# Patient Record
Sex: Female | Born: 1935 | Race: White | Hispanic: No | Smoking: Never smoker
Health system: Southern US, Community
[De-identification: ages and names within clinical notes are randomized; demographics above are authoritative.]

## PROBLEM LIST (undated history)

## (undated) DIAGNOSIS — J45909 Unspecified asthma, uncomplicated: Secondary | ICD-10-CM

## (undated) DIAGNOSIS — Z974 Presence of external hearing-aid: Secondary | ICD-10-CM

## (undated) DIAGNOSIS — R600 Localized edema: Secondary | ICD-10-CM

## (undated) DIAGNOSIS — I82409 Acute embolism and thrombosis of unspecified deep veins of unspecified lower extremity: Secondary | ICD-10-CM

## (undated) DIAGNOSIS — E785 Hyperlipidemia, unspecified: Secondary | ICD-10-CM

## (undated) DIAGNOSIS — N189 Chronic kidney disease, unspecified: Secondary | ICD-10-CM

## (undated) DIAGNOSIS — G4730 Sleep apnea, unspecified: Secondary | ICD-10-CM

## (undated) DIAGNOSIS — R519 Headache, unspecified: Secondary | ICD-10-CM

## (undated) DIAGNOSIS — I2699 Other pulmonary embolism without acute cor pulmonale: Secondary | ICD-10-CM

## (undated) DIAGNOSIS — I10 Essential (primary) hypertension: Secondary | ICD-10-CM

## (undated) DIAGNOSIS — E079 Disorder of thyroid, unspecified: Secondary | ICD-10-CM

## (undated) DIAGNOSIS — M1990 Unspecified osteoarthritis, unspecified site: Secondary | ICD-10-CM

## (undated) DIAGNOSIS — I509 Heart failure, unspecified: Secondary | ICD-10-CM

## (undated) HISTORY — DX: Disorder of thyroid, unspecified: E07.9

## (undated) HISTORY — DX: Acute embolism and thrombosis of unspecified deep veins of unspecified lower extremity: I82.409

## (undated) HISTORY — PX: DILATATION & CURETTAGE/HYSTEROSCOPY WITH MYOSURE: SHX6511

## (undated) HISTORY — DX: Essential (primary) hypertension: I10

## (undated) HISTORY — PX: VARICOSE VEIN SURGERY: SHX832

## (undated) HISTORY — PX: EYE SURGERY: SHX253

## (undated) HISTORY — PX: OTHER SURGICAL HISTORY: SHX169

## (undated) HISTORY — PX: DILATION AND CURETTAGE OF UTERUS: SHX78

## (undated) HISTORY — DX: Other pulmonary embolism without acute cor pulmonale: I26.99

## (undated) HISTORY — DX: Hyperlipidemia, unspecified: E78.5

## (undated) HISTORY — PX: ENDOMETRIAL BIOPSY: SHX622

---

## 1999-12-05 DIAGNOSIS — I2699 Other pulmonary embolism without acute cor pulmonale: Secondary | ICD-10-CM

## 1999-12-05 DIAGNOSIS — I82409 Acute embolism and thrombosis of unspecified deep veins of unspecified lower extremity: Secondary | ICD-10-CM

## 1999-12-05 HISTORY — DX: Acute embolism and thrombosis of unspecified deep veins of unspecified lower extremity: I82.409

## 1999-12-05 HISTORY — DX: Other pulmonary embolism without acute cor pulmonale: I26.99

## 2005-08-15 ENCOUNTER — Ambulatory Visit: Payer: Self-pay | Admitting: Unknown Physician Specialty

## 2006-08-21 ENCOUNTER — Ambulatory Visit: Payer: Self-pay | Admitting: Unknown Physician Specialty

## 2006-08-22 ENCOUNTER — Ambulatory Visit: Payer: Self-pay | Admitting: Internal Medicine

## 2006-10-05 ENCOUNTER — Other Ambulatory Visit: Payer: Self-pay

## 2006-10-05 ENCOUNTER — Ambulatory Visit: Payer: Self-pay | Admitting: General Surgery

## 2006-10-12 ENCOUNTER — Ambulatory Visit: Payer: Self-pay | Admitting: General Surgery

## 2006-11-16 ENCOUNTER — Ambulatory Visit: Payer: Self-pay | Admitting: Gastroenterology

## 2006-11-23 ENCOUNTER — Ambulatory Visit: Payer: Self-pay | Admitting: Gastroenterology

## 2007-02-15 ENCOUNTER — Ambulatory Visit: Payer: Self-pay | Admitting: Gastroenterology

## 2007-10-23 ENCOUNTER — Ambulatory Visit: Payer: Self-pay | Admitting: Unknown Physician Specialty

## 2008-03-12 ENCOUNTER — Emergency Department: Payer: Self-pay | Admitting: Emergency Medicine

## 2008-11-04 ENCOUNTER — Ambulatory Visit: Payer: Self-pay | Admitting: Unknown Physician Specialty

## 2010-01-25 ENCOUNTER — Ambulatory Visit: Payer: Self-pay | Admitting: Unknown Physician Specialty

## 2010-01-26 ENCOUNTER — Ambulatory Visit: Payer: Self-pay | Admitting: Internal Medicine

## 2010-07-05 ENCOUNTER — Ambulatory Visit: Payer: Self-pay | Admitting: Internal Medicine

## 2011-01-17 ENCOUNTER — Ambulatory Visit: Payer: Self-pay | Admitting: Gastroenterology

## 2011-01-19 LAB — PATHOLOGY REPORT

## 2011-01-27 ENCOUNTER — Ambulatory Visit: Payer: Self-pay | Admitting: Unknown Physician Specialty

## 2011-03-29 ENCOUNTER — Inpatient Hospital Stay: Payer: Self-pay | Admitting: General Practice

## 2011-05-10 ENCOUNTER — Other Ambulatory Visit: Payer: Self-pay | Admitting: Vascular Surgery

## 2011-05-24 ENCOUNTER — Other Ambulatory Visit: Payer: Self-pay | Admitting: Family Medicine

## 2011-07-10 ENCOUNTER — Other Ambulatory Visit: Payer: Self-pay | Admitting: Family Medicine

## 2011-08-28 ENCOUNTER — Other Ambulatory Visit: Payer: Self-pay | Admitting: Internal Medicine

## 2011-09-04 ENCOUNTER — Other Ambulatory Visit: Payer: Self-pay | Admitting: Internal Medicine

## 2011-09-11 ENCOUNTER — Ambulatory Visit: Payer: Self-pay | Admitting: Vascular Surgery

## 2012-03-13 ENCOUNTER — Ambulatory Visit: Payer: Self-pay

## 2013-08-12 ENCOUNTER — Ambulatory Visit: Payer: Self-pay | Admitting: Obstetrics and Gynecology

## 2013-08-12 LAB — URINALYSIS, COMPLETE
Blood: NEGATIVE
Hyaline Cast: 5
Leukocyte Esterase: NEGATIVE
Nitrite: NEGATIVE
Ph: 5 (ref 4.5–8.0)
Squamous Epithelial: <1

## 2013-08-12 LAB — COMPREHENSIVE METABOLIC PANEL
Anion Gap: 4 — ABNORMAL LOW (ref 7–16)
BUN: 17 mg/dL (ref 7–18)
Bilirubin,Total: 0.8 mg/dL (ref 0.2–1.0)
Calcium, Total: 9.5 mg/dL (ref 8.5–10.1)
Co2: 31 mmol/L (ref 21–32)
Creatinine: 0.92 mg/dL (ref 0.60–1.30)
EGFR (African American): >60
EGFR (Non-African Amer.): >60
Glucose: 94 mg/dL (ref 65–99)
Potassium: 3.8 mmol/L (ref 3.5–5.1)
SGOT(AST): 25 U/L (ref 15–37)
SGPT (ALT): 27 U/L (ref 12–78)
Sodium: 138 mmol/L (ref 136–145)

## 2013-08-12 LAB — CBC
HGB: 14.9 g/dL (ref 12.0–16.0)
MCH: 31.1 pg (ref 26.0–34.0)
MCHC: 33.8 g/dL (ref 32.0–36.0)
Platelet: 185 10*3/uL (ref 150–440)
RBC: 4.79 10*6/uL (ref 3.80–5.20)
RDW: 13.9 % (ref 11.5–14.5)

## 2013-08-21 ENCOUNTER — Ambulatory Visit: Payer: Self-pay | Admitting: Obstetrics and Gynecology

## 2013-08-22 LAB — PATHOLOGY REPORT

## 2014-06-19 DIAGNOSIS — Z8371 Family history of colonic polyps: Secondary | ICD-10-CM | POA: Insufficient documentation

## 2014-06-19 DIAGNOSIS — Z83719 Family history of colon polyps, unspecified: Secondary | ICD-10-CM | POA: Insufficient documentation

## 2014-06-23 ENCOUNTER — Ambulatory Visit: Payer: Self-pay | Admitting: Gastroenterology

## 2014-06-26 LAB — PATHOLOGY REPORT

## 2015-01-05 DIAGNOSIS — I10 Essential (primary) hypertension: Secondary | ICD-10-CM | POA: Diagnosis not present

## 2015-01-05 DIAGNOSIS — E039 Hypothyroidism, unspecified: Secondary | ICD-10-CM | POA: Diagnosis not present

## 2015-01-05 DIAGNOSIS — I83813 Varicose veins of bilateral lower extremities with pain: Secondary | ICD-10-CM | POA: Diagnosis not present

## 2015-01-05 DIAGNOSIS — F330 Major depressive disorder, recurrent, mild: Secondary | ICD-10-CM | POA: Diagnosis not present

## 2015-02-09 DIAGNOSIS — I10 Essential (primary) hypertension: Secondary | ICD-10-CM | POA: Diagnosis not present

## 2015-02-09 DIAGNOSIS — I80293 Phlebitis and thrombophlebitis of other deep vessels of lower extremity, bilateral: Secondary | ICD-10-CM | POA: Diagnosis not present

## 2015-02-09 DIAGNOSIS — E039 Hypothyroidism, unspecified: Secondary | ICD-10-CM | POA: Diagnosis not present

## 2015-02-09 DIAGNOSIS — Z6832 Body mass index (BMI) 32.0-32.9, adult: Secondary | ICD-10-CM | POA: Diagnosis not present

## 2015-02-09 DIAGNOSIS — J060 Acute laryngopharyngitis: Secondary | ICD-10-CM | POA: Diagnosis not present

## 2015-02-09 DIAGNOSIS — N390 Urinary tract infection, site not specified: Secondary | ICD-10-CM | POA: Diagnosis not present

## 2015-02-13 DIAGNOSIS — E039 Hypothyroidism, unspecified: Secondary | ICD-10-CM | POA: Diagnosis not present

## 2015-02-13 DIAGNOSIS — Z86711 Personal history of pulmonary embolism: Secondary | ICD-10-CM | POA: Diagnosis not present

## 2015-02-13 DIAGNOSIS — I10 Essential (primary) hypertension: Secondary | ICD-10-CM | POA: Diagnosis not present

## 2015-02-13 DIAGNOSIS — J449 Chronic obstructive pulmonary disease, unspecified: Secondary | ICD-10-CM | POA: Diagnosis not present

## 2015-02-13 DIAGNOSIS — E785 Hyperlipidemia, unspecified: Secondary | ICD-10-CM | POA: Diagnosis not present

## 2015-02-13 DIAGNOSIS — M2550 Pain in unspecified joint: Secondary | ICD-10-CM | POA: Diagnosis not present

## 2015-02-13 DIAGNOSIS — K219 Gastro-esophageal reflux disease without esophagitis: Secondary | ICD-10-CM | POA: Diagnosis not present

## 2015-02-13 DIAGNOSIS — E669 Obesity, unspecified: Secondary | ICD-10-CM | POA: Diagnosis not present

## 2015-02-13 DIAGNOSIS — I739 Peripheral vascular disease, unspecified: Secondary | ICD-10-CM | POA: Diagnosis not present

## 2015-02-13 DIAGNOSIS — J45909 Unspecified asthma, uncomplicated: Secondary | ICD-10-CM | POA: Diagnosis not present

## 2015-03-19 DIAGNOSIS — G4733 Obstructive sleep apnea (adult) (pediatric): Secondary | ICD-10-CM | POA: Diagnosis not present

## 2015-03-26 NOTE — Op Note (Signed)
PATIENT NAME:  Nancy Moyer, Nancy Moyer MR#:  [id number] DATE OF BIRTH:  [birth year]  DATE OF PROCEDURE:  08/21/2013  PREOPERATIVE DIAGNOSIS: Submucosal fibroid and postmenopausal bleeding.   POSTOPERATIVE DIAGNOSIS: Submucosal fibroid and postmenopausal bleeding.   PROCEDURE: Hysteroscopic myomectomy with MyoSure.   ANESTHESIA: General.  SURGEON: Dr. Donzetta Matters.   ESTIMATED BLOOD LOSS: 50 mL.   OPERATIVE FLUIDS: One liter deficit of 1400 mL of lactated Ringer.   COMPLICATIONS: Possible uterine perforation.   FINDINGS: Posterior submucosal fibroid.   SPECIMEN: Endometrial curettings and fibroids.   INDICATIONS: The patient is a 79 year old who presents with postmenopausal bleeding. She was found to have a possible submucosal uterine fibroid at the time of ultrasound. In office sonohysterogram could not be completed secondary to severe cervical stenosis. The patient is brought to the operating room for surgical evaluation and removal of the submucosal fibroid. The risks, benefits, indications, and alternatives of the procedure were explained to the patient and informed consent was obtained.   PROCEDURE: The patient was taken to the Operating Room with IV fluids running. She was placed under general anesthesia. She was prepped and draped in the usual sterile fashion in candy cane stirrups. Speculum was placed inside of the vagina. The anterior lip of the cervix was grasped with a single-tooth tenaculum. The cervical os was again found to be stenotic. A  lacrimal duct probe was used to locate the cervical os and entered carefully. The cervix was then able to be dilated using Hank dilators. The hysteroscope was placed inside of the uterus and findings were as previously stated. The MyoSure device was then placed in through the input channel on the hysteroscope. The fibroid was then resected. At one portion during the procedure it was noted that resection was no longer able to be completed  secondary to the fibroid likely being calcified. The hysteroscope was removed from the patient's uterus and serrated edge banjo curette was used to attempt to remove the fibroid tissue. The hysteroscope was then replaced and it was seen that the fibroid was still in place. The MyoSure device was then replaced through the hysteroscope and further resection was attempted. On the third bag of lactated Ringer's distention media, it was noted that the deficit was increased to approximately 1400 mL indicating a possible uterine perforation. At this point, the procedure was ended. All instrumentation was removed from the patient's vagina and cervix was hemostatic. Sponge and instrument counts were correct x2. The patient was awakened from anesthesia and taken to the recovery room in stable condition.   ____________________________ Rolm Gala Ferne Reus, MD law:sg D: 08/21/2013 10:50:45 ET T: 08/21/2013 11:47:30 ET JOB#: [id number]  cc: Sherlynn Carbon A. Ferne Reus, MD, <Dictator> Rolm Gala WEAVER LEE MD ELECTRONICALLY SIGNED 08/26/2013 14:09

## 2015-05-18 DIAGNOSIS — F330 Major depressive disorder, recurrent, mild: Secondary | ICD-10-CM | POA: Diagnosis not present

## 2015-05-18 DIAGNOSIS — I10 Essential (primary) hypertension: Secondary | ICD-10-CM | POA: Diagnosis not present

## 2015-05-18 DIAGNOSIS — E782 Mixed hyperlipidemia: Secondary | ICD-10-CM | POA: Diagnosis not present

## 2015-05-18 DIAGNOSIS — I83813 Varicose veins of bilateral lower extremities with pain: Secondary | ICD-10-CM | POA: Diagnosis not present

## 2015-05-18 DIAGNOSIS — R300 Dysuria: Secondary | ICD-10-CM | POA: Diagnosis not present

## 2015-05-18 DIAGNOSIS — Z0001 Encounter for general adult medical examination with abnormal findings: Secondary | ICD-10-CM | POA: Diagnosis not present

## 2015-05-18 DIAGNOSIS — E039 Hypothyroidism, unspecified: Secondary | ICD-10-CM | POA: Diagnosis not present

## 2015-05-18 DIAGNOSIS — M810 Age-related osteoporosis without current pathological fracture: Secondary | ICD-10-CM | POA: Diagnosis not present

## 2015-05-27 DIAGNOSIS — M8580 Other specified disorders of bone density and structure, unspecified site: Secondary | ICD-10-CM | POA: Diagnosis not present

## 2015-05-27 DIAGNOSIS — Z780 Asymptomatic menopausal state: Secondary | ICD-10-CM | POA: Diagnosis not present

## 2015-05-27 DIAGNOSIS — M85862 Other specified disorders of bone density and structure, left lower leg: Secondary | ICD-10-CM | POA: Diagnosis not present

## 2015-05-27 DIAGNOSIS — Z13820 Encounter for screening for osteoporosis: Secondary | ICD-10-CM | POA: Diagnosis not present

## 2015-05-27 DIAGNOSIS — M8588 Other specified disorders of bone density and structure, other site: Secondary | ICD-10-CM | POA: Diagnosis not present

## 2015-07-12 DIAGNOSIS — Z1151 Encounter for screening for human papillomavirus (HPV): Secondary | ICD-10-CM | POA: Diagnosis not present

## 2015-07-12 DIAGNOSIS — N912 Amenorrhea, unspecified: Secondary | ICD-10-CM | POA: Diagnosis not present

## 2015-07-12 DIAGNOSIS — Z1231 Encounter for screening mammogram for malignant neoplasm of breast: Secondary | ICD-10-CM | POA: Diagnosis not present

## 2015-09-20 DIAGNOSIS — I10 Essential (primary) hypertension: Secondary | ICD-10-CM | POA: Diagnosis not present

## 2015-09-20 DIAGNOSIS — E039 Hypothyroidism, unspecified: Secondary | ICD-10-CM | POA: Diagnosis not present

## 2015-09-20 DIAGNOSIS — R7301 Impaired fasting glucose: Secondary | ICD-10-CM | POA: Diagnosis not present

## 2015-09-20 DIAGNOSIS — M810 Age-related osteoporosis without current pathological fracture: Secondary | ICD-10-CM | POA: Diagnosis not present

## 2015-09-20 DIAGNOSIS — F330 Major depressive disorder, recurrent, mild: Secondary | ICD-10-CM | POA: Diagnosis not present

## 2015-09-20 DIAGNOSIS — I83813 Varicose veins of bilateral lower extremities with pain: Secondary | ICD-10-CM | POA: Diagnosis not present

## 2015-09-20 DIAGNOSIS — Z0001 Encounter for general adult medical examination with abnormal findings: Secondary | ICD-10-CM | POA: Diagnosis not present

## 2015-09-20 DIAGNOSIS — E782 Mixed hyperlipidemia: Secondary | ICD-10-CM | POA: Diagnosis not present

## 2015-10-27 DIAGNOSIS — N189 Chronic kidney disease, unspecified: Secondary | ICD-10-CM | POA: Diagnosis not present

## 2015-11-01 DIAGNOSIS — I10 Essential (primary) hypertension: Secondary | ICD-10-CM | POA: Diagnosis not present

## 2015-11-01 DIAGNOSIS — I83813 Varicose veins of bilateral lower extremities with pain: Secondary | ICD-10-CM | POA: Diagnosis not present

## 2015-11-01 DIAGNOSIS — E039 Hypothyroidism, unspecified: Secondary | ICD-10-CM | POA: Diagnosis not present

## 2015-11-01 DIAGNOSIS — Z23 Encounter for immunization: Secondary | ICD-10-CM | POA: Diagnosis not present

## 2015-11-01 DIAGNOSIS — R7301 Impaired fasting glucose: Secondary | ICD-10-CM | POA: Diagnosis not present

## 2015-11-01 DIAGNOSIS — N181 Chronic kidney disease, stage 1: Secondary | ICD-10-CM | POA: Diagnosis not present

## 2015-11-17 DIAGNOSIS — L03211 Cellulitis of face: Secondary | ICD-10-CM | POA: Diagnosis not present

## 2015-11-17 DIAGNOSIS — E039 Hypothyroidism, unspecified: Secondary | ICD-10-CM | POA: Diagnosis not present

## 2015-11-17 DIAGNOSIS — I10 Essential (primary) hypertension: Secondary | ICD-10-CM | POA: Diagnosis not present

## 2015-11-17 DIAGNOSIS — M810 Age-related osteoporosis without current pathological fracture: Secondary | ICD-10-CM | POA: Diagnosis not present

## 2016-01-26 DIAGNOSIS — E782 Mixed hyperlipidemia: Secondary | ICD-10-CM | POA: Diagnosis not present

## 2016-01-26 DIAGNOSIS — I10 Essential (primary) hypertension: Secondary | ICD-10-CM | POA: Diagnosis not present

## 2016-01-26 DIAGNOSIS — E039 Hypothyroidism, unspecified: Secondary | ICD-10-CM | POA: Diagnosis not present

## 2016-02-01 DIAGNOSIS — F330 Major depressive disorder, recurrent, mild: Secondary | ICD-10-CM | POA: Diagnosis not present

## 2016-02-01 DIAGNOSIS — N181 Chronic kidney disease, stage 1: Secondary | ICD-10-CM | POA: Diagnosis not present

## 2016-02-01 DIAGNOSIS — I10 Essential (primary) hypertension: Secondary | ICD-10-CM | POA: Diagnosis not present

## 2016-02-01 DIAGNOSIS — L309 Dermatitis, unspecified: Secondary | ICD-10-CM | POA: Diagnosis not present

## 2016-03-02 DIAGNOSIS — G4733 Obstructive sleep apnea (adult) (pediatric): Secondary | ICD-10-CM | POA: Diagnosis not present

## 2016-05-22 DIAGNOSIS — N390 Urinary tract infection, site not specified: Secondary | ICD-10-CM | POA: Diagnosis not present

## 2016-05-22 DIAGNOSIS — M810 Age-related osteoporosis without current pathological fracture: Secondary | ICD-10-CM | POA: Diagnosis not present

## 2016-05-22 DIAGNOSIS — R7301 Impaired fasting glucose: Secondary | ICD-10-CM | POA: Diagnosis not present

## 2016-05-22 DIAGNOSIS — Z0001 Encounter for general adult medical examination with abnormal findings: Secondary | ICD-10-CM | POA: Diagnosis not present

## 2016-05-22 DIAGNOSIS — E039 Hypothyroidism, unspecified: Secondary | ICD-10-CM | POA: Diagnosis not present

## 2016-05-22 DIAGNOSIS — I83813 Varicose veins of bilateral lower extremities with pain: Secondary | ICD-10-CM | POA: Diagnosis not present

## 2016-05-22 DIAGNOSIS — I10 Essential (primary) hypertension: Secondary | ICD-10-CM | POA: Diagnosis not present

## 2016-05-24 DIAGNOSIS — I83813 Varicose veins of bilateral lower extremities with pain: Secondary | ICD-10-CM | POA: Diagnosis not present

## 2016-06-15 DIAGNOSIS — G4733 Obstructive sleep apnea (adult) (pediatric): Secondary | ICD-10-CM | POA: Diagnosis not present

## 2016-06-27 DIAGNOSIS — F330 Major depressive disorder, recurrent, mild: Secondary | ICD-10-CM | POA: Diagnosis not present

## 2016-06-27 DIAGNOSIS — N390 Urinary tract infection, site not specified: Secondary | ICD-10-CM | POA: Diagnosis not present

## 2016-06-27 DIAGNOSIS — M810 Age-related osteoporosis without current pathological fracture: Secondary | ICD-10-CM | POA: Diagnosis not present

## 2016-06-27 DIAGNOSIS — J0111 Acute recurrent frontal sinusitis: Secondary | ICD-10-CM | POA: Diagnosis not present

## 2016-06-27 DIAGNOSIS — I83813 Varicose veins of bilateral lower extremities with pain: Secondary | ICD-10-CM | POA: Diagnosis not present

## 2016-06-27 DIAGNOSIS — R601 Generalized edema: Secondary | ICD-10-CM | POA: Diagnosis not present

## 2016-06-27 DIAGNOSIS — J301 Allergic rhinitis due to pollen: Secondary | ICD-10-CM | POA: Diagnosis not present

## 2016-06-28 DIAGNOSIS — R601 Generalized edema: Secondary | ICD-10-CM | POA: Diagnosis not present

## 2016-07-06 DIAGNOSIS — J0111 Acute recurrent frontal sinusitis: Secondary | ICD-10-CM | POA: Diagnosis not present

## 2016-07-06 DIAGNOSIS — N393 Stress incontinence (female) (male): Secondary | ICD-10-CM | POA: Diagnosis not present

## 2016-07-06 DIAGNOSIS — I83813 Varicose veins of bilateral lower extremities with pain: Secondary | ICD-10-CM | POA: Diagnosis not present

## 2016-07-06 DIAGNOSIS — I10 Essential (primary) hypertension: Secondary | ICD-10-CM | POA: Diagnosis not present

## 2016-07-06 DIAGNOSIS — N390 Urinary tract infection, site not specified: Secondary | ICD-10-CM | POA: Diagnosis not present

## 2016-08-04 DIAGNOSIS — I10 Essential (primary) hypertension: Secondary | ICD-10-CM | POA: Diagnosis not present

## 2016-08-04 DIAGNOSIS — I80293 Phlebitis and thrombophlebitis of other deep vessels of lower extremity, bilateral: Secondary | ICD-10-CM | POA: Diagnosis not present

## 2016-08-04 DIAGNOSIS — M5442 Lumbago with sciatica, left side: Secondary | ICD-10-CM | POA: Diagnosis not present

## 2016-08-08 DIAGNOSIS — M5442 Lumbago with sciatica, left side: Secondary | ICD-10-CM | POA: Diagnosis not present

## 2016-08-08 DIAGNOSIS — D692 Other nonthrombocytopenic purpura: Secondary | ICD-10-CM | POA: Diagnosis not present

## 2016-08-08 DIAGNOSIS — F330 Major depressive disorder, recurrent, mild: Secondary | ICD-10-CM | POA: Diagnosis not present

## 2016-08-08 DIAGNOSIS — I10 Essential (primary) hypertension: Secondary | ICD-10-CM | POA: Diagnosis not present

## 2016-08-08 DIAGNOSIS — I83813 Varicose veins of bilateral lower extremities with pain: Secondary | ICD-10-CM | POA: Diagnosis not present

## 2016-08-08 DIAGNOSIS — Z6832 Body mass index (BMI) 32.0-32.9, adult: Secondary | ICD-10-CM | POA: Diagnosis not present

## 2016-08-08 DIAGNOSIS — I80293 Phlebitis and thrombophlebitis of other deep vessels of lower extremity, bilateral: Secondary | ICD-10-CM | POA: Diagnosis not present

## 2016-08-28 DIAGNOSIS — H2513 Age-related nuclear cataract, bilateral: Secondary | ICD-10-CM | POA: Diagnosis not present

## 2016-09-07 DIAGNOSIS — I10 Essential (primary) hypertension: Secondary | ICD-10-CM | POA: Diagnosis not present

## 2016-09-26 DIAGNOSIS — L309 Dermatitis, unspecified: Secondary | ICD-10-CM | POA: Diagnosis not present

## 2016-09-26 DIAGNOSIS — Z23 Encounter for immunization: Secondary | ICD-10-CM | POA: Diagnosis not present

## 2016-09-26 DIAGNOSIS — E669 Obesity, unspecified: Secondary | ICD-10-CM | POA: Diagnosis not present

## 2016-09-26 DIAGNOSIS — I83813 Varicose veins of bilateral lower extremities with pain: Secondary | ICD-10-CM | POA: Diagnosis not present

## 2016-09-26 DIAGNOSIS — I10 Essential (primary) hypertension: Secondary | ICD-10-CM | POA: Diagnosis not present

## 2016-09-26 DIAGNOSIS — E782 Mixed hyperlipidemia: Secondary | ICD-10-CM | POA: Diagnosis not present

## 2016-09-26 DIAGNOSIS — E039 Hypothyroidism, unspecified: Secondary | ICD-10-CM | POA: Diagnosis not present

## 2016-10-03 DIAGNOSIS — G4733 Obstructive sleep apnea (adult) (pediatric): Secondary | ICD-10-CM | POA: Diagnosis not present

## 2016-12-26 DIAGNOSIS — K006 Disturbances in tooth eruption: Secondary | ICD-10-CM | POA: Diagnosis not present

## 2016-12-26 DIAGNOSIS — R69 Illness, unspecified: Secondary | ICD-10-CM | POA: Diagnosis not present

## 2017-01-25 DIAGNOSIS — I10 Essential (primary) hypertension: Secondary | ICD-10-CM | POA: Diagnosis not present

## 2017-01-25 DIAGNOSIS — G4489 Other headache syndrome: Secondary | ICD-10-CM | POA: Diagnosis not present

## 2017-01-25 DIAGNOSIS — N390 Urinary tract infection, site not specified: Secondary | ICD-10-CM | POA: Diagnosis not present

## 2017-01-25 DIAGNOSIS — H6501 Acute serous otitis media, right ear: Secondary | ICD-10-CM | POA: Diagnosis not present

## 2017-01-25 DIAGNOSIS — I80293 Phlebitis and thrombophlebitis of other deep vessels of lower extremity, bilateral: Secondary | ICD-10-CM | POA: Diagnosis not present

## 2017-02-27 DIAGNOSIS — N390 Urinary tract infection, site not specified: Secondary | ICD-10-CM | POA: Diagnosis not present

## 2017-02-27 DIAGNOSIS — J019 Acute sinusitis, unspecified: Secondary | ICD-10-CM | POA: Diagnosis not present

## 2017-02-27 DIAGNOSIS — I10 Essential (primary) hypertension: Secondary | ICD-10-CM | POA: Diagnosis not present

## 2017-02-27 DIAGNOSIS — F330 Major depressive disorder, recurrent, mild: Secondary | ICD-10-CM | POA: Diagnosis not present

## 2017-02-27 DIAGNOSIS — M150 Primary generalized (osteo)arthritis: Secondary | ICD-10-CM | POA: Diagnosis not present

## 2017-04-11 DIAGNOSIS — L209 Atopic dermatitis, unspecified: Secondary | ICD-10-CM | POA: Diagnosis not present

## 2017-04-11 DIAGNOSIS — F330 Major depressive disorder, recurrent, mild: Secondary | ICD-10-CM | POA: Diagnosis not present

## 2017-04-11 DIAGNOSIS — I80293 Phlebitis and thrombophlebitis of other deep vessels of lower extremity, bilateral: Secondary | ICD-10-CM | POA: Diagnosis not present

## 2017-04-11 DIAGNOSIS — M810 Age-related osteoporosis without current pathological fracture: Secondary | ICD-10-CM | POA: Diagnosis not present

## 2017-04-11 DIAGNOSIS — I10 Essential (primary) hypertension: Secondary | ICD-10-CM | POA: Diagnosis not present

## 2017-05-16 DIAGNOSIS — E559 Vitamin D deficiency, unspecified: Secondary | ICD-10-CM | POA: Diagnosis not present

## 2017-05-16 DIAGNOSIS — E039 Hypothyroidism, unspecified: Secondary | ICD-10-CM | POA: Diagnosis not present

## 2017-05-16 DIAGNOSIS — I10 Essential (primary) hypertension: Secondary | ICD-10-CM | POA: Diagnosis not present

## 2017-05-16 DIAGNOSIS — Z0001 Encounter for general adult medical examination with abnormal findings: Secondary | ICD-10-CM | POA: Diagnosis not present

## 2017-05-24 DIAGNOSIS — E782 Mixed hyperlipidemia: Secondary | ICD-10-CM | POA: Diagnosis not present

## 2017-05-24 DIAGNOSIS — N390 Urinary tract infection, site not specified: Secondary | ICD-10-CM | POA: Diagnosis not present

## 2017-05-24 DIAGNOSIS — E039 Hypothyroidism, unspecified: Secondary | ICD-10-CM | POA: Diagnosis not present

## 2017-05-24 DIAGNOSIS — M810 Age-related osteoporosis without current pathological fracture: Secondary | ICD-10-CM | POA: Diagnosis not present

## 2017-05-24 DIAGNOSIS — Z0001 Encounter for general adult medical examination with abnormal findings: Secondary | ICD-10-CM | POA: Diagnosis not present

## 2017-05-24 DIAGNOSIS — I10 Essential (primary) hypertension: Secondary | ICD-10-CM | POA: Diagnosis not present

## 2017-05-24 DIAGNOSIS — J019 Acute sinusitis, unspecified: Secondary | ICD-10-CM | POA: Diagnosis not present

## 2017-05-24 DIAGNOSIS — J45909 Unspecified asthma, uncomplicated: Secondary | ICD-10-CM | POA: Diagnosis not present

## 2017-06-07 DIAGNOSIS — Z1231 Encounter for screening mammogram for malignant neoplasm of breast: Secondary | ICD-10-CM | POA: Diagnosis not present

## 2017-06-07 DIAGNOSIS — M8588 Other specified disorders of bone density and structure, other site: Secondary | ICD-10-CM | POA: Diagnosis not present

## 2017-06-07 DIAGNOSIS — E2839 Other primary ovarian failure: Secondary | ICD-10-CM | POA: Diagnosis not present

## 2017-06-07 DIAGNOSIS — Z780 Asymptomatic menopausal state: Secondary | ICD-10-CM | POA: Diagnosis not present

## 2017-06-18 DIAGNOSIS — R944 Abnormal results of kidney function studies: Secondary | ICD-10-CM | POA: Diagnosis not present

## 2017-06-26 DIAGNOSIS — M150 Primary generalized (osteo)arthritis: Secondary | ICD-10-CM | POA: Diagnosis not present

## 2017-06-26 DIAGNOSIS — E782 Mixed hyperlipidemia: Secondary | ICD-10-CM | POA: Diagnosis not present

## 2017-06-26 DIAGNOSIS — R601 Generalized edema: Secondary | ICD-10-CM | POA: Diagnosis not present

## 2017-06-26 DIAGNOSIS — I10 Essential (primary) hypertension: Secondary | ICD-10-CM | POA: Diagnosis not present

## 2017-06-26 DIAGNOSIS — I739 Peripheral vascular disease, unspecified: Secondary | ICD-10-CM | POA: Diagnosis not present

## 2017-06-26 DIAGNOSIS — N390 Urinary tract infection, site not specified: Secondary | ICD-10-CM | POA: Diagnosis not present

## 2017-08-23 DIAGNOSIS — M150 Primary generalized (osteo)arthritis: Secondary | ICD-10-CM | POA: Diagnosis not present

## 2017-08-23 DIAGNOSIS — E039 Hypothyroidism, unspecified: Secondary | ICD-10-CM | POA: Diagnosis not present

## 2017-08-23 DIAGNOSIS — I739 Peripheral vascular disease, unspecified: Secondary | ICD-10-CM | POA: Diagnosis not present

## 2017-08-23 DIAGNOSIS — I10 Essential (primary) hypertension: Secondary | ICD-10-CM | POA: Diagnosis not present

## 2017-08-23 DIAGNOSIS — L209 Atopic dermatitis, unspecified: Secondary | ICD-10-CM | POA: Diagnosis not present

## 2017-10-03 DIAGNOSIS — H2513 Age-related nuclear cataract, bilateral: Secondary | ICD-10-CM | POA: Diagnosis not present

## 2017-10-31 DIAGNOSIS — J060 Acute laryngopharyngitis: Secondary | ICD-10-CM | POA: Diagnosis not present

## 2017-10-31 DIAGNOSIS — I10 Essential (primary) hypertension: Secondary | ICD-10-CM | POA: Diagnosis not present

## 2017-11-01 ENCOUNTER — Other Ambulatory Visit: Payer: Self-pay | Admitting: Nurse Practitioner

## 2017-11-01 DIAGNOSIS — R05 Cough: Secondary | ICD-10-CM

## 2017-11-01 DIAGNOSIS — R059 Cough, unspecified: Secondary | ICD-10-CM

## 2017-11-02 DIAGNOSIS — J984 Other disorders of lung: Secondary | ICD-10-CM | POA: Diagnosis not present

## 2017-11-02 DIAGNOSIS — R0602 Shortness of breath: Secondary | ICD-10-CM | POA: Diagnosis not present

## 2017-11-02 DIAGNOSIS — I517 Cardiomegaly: Secondary | ICD-10-CM | POA: Diagnosis not present

## 2017-11-02 DIAGNOSIS — M40204 Unspecified kyphosis, thoracic region: Secondary | ICD-10-CM | POA: Diagnosis not present

## 2017-11-02 DIAGNOSIS — R05 Cough: Secondary | ICD-10-CM | POA: Diagnosis not present

## 2017-11-02 DIAGNOSIS — R918 Other nonspecific abnormal finding of lung field: Secondary | ICD-10-CM | POA: Diagnosis not present

## 2017-11-07 DIAGNOSIS — H2513 Age-related nuclear cataract, bilateral: Secondary | ICD-10-CM | POA: Diagnosis not present

## 2017-11-12 DIAGNOSIS — M25571 Pain in right ankle and joints of right foot: Secondary | ICD-10-CM | POA: Diagnosis not present

## 2017-11-12 DIAGNOSIS — S82851A Displaced trimalleolar fracture of right lower leg, initial encounter for closed fracture: Secondary | ICD-10-CM | POA: Diagnosis not present

## 2017-11-12 DIAGNOSIS — M1990 Unspecified osteoarthritis, unspecified site: Secondary | ICD-10-CM | POA: Diagnosis not present

## 2017-11-12 DIAGNOSIS — E039 Hypothyroidism, unspecified: Secondary | ICD-10-CM | POA: Diagnosis not present

## 2017-11-12 DIAGNOSIS — M25471 Effusion, right ankle: Secondary | ICD-10-CM | POA: Diagnosis not present

## 2017-11-12 DIAGNOSIS — I10 Essential (primary) hypertension: Secondary | ICD-10-CM | POA: Diagnosis not present

## 2017-11-12 DIAGNOSIS — E785 Hyperlipidemia, unspecified: Secondary | ICD-10-CM | POA: Diagnosis not present

## 2017-11-12 DIAGNOSIS — S8251XA Displaced fracture of medial malleolus of right tibia, initial encounter for closed fracture: Secondary | ICD-10-CM | POA: Diagnosis not present

## 2017-11-12 DIAGNOSIS — R600 Localized edema: Secondary | ICD-10-CM | POA: Diagnosis not present

## 2017-11-12 DIAGNOSIS — J45909 Unspecified asthma, uncomplicated: Secondary | ICD-10-CM | POA: Diagnosis not present

## 2017-11-12 DIAGNOSIS — S82891A Other fracture of right lower leg, initial encounter for closed fracture: Secondary | ICD-10-CM | POA: Diagnosis not present

## 2017-11-12 DIAGNOSIS — G4733 Obstructive sleep apnea (adult) (pediatric): Secondary | ICD-10-CM | POA: Diagnosis not present

## 2017-11-12 DIAGNOSIS — S82201D Unspecified fracture of shaft of right tibia, subsequent encounter for closed fracture with routine healing: Secondary | ICD-10-CM | POA: Diagnosis not present

## 2017-11-12 DIAGNOSIS — Z86718 Personal history of other venous thrombosis and embolism: Secondary | ICD-10-CM | POA: Insufficient documentation

## 2017-11-12 DIAGNOSIS — S82401D Unspecified fracture of shaft of right fibula, subsequent encounter for closed fracture with routine healing: Secondary | ICD-10-CM | POA: Diagnosis not present

## 2017-11-12 DIAGNOSIS — S82891P Other fracture of right lower leg, subsequent encounter for closed fracture with malunion: Secondary | ICD-10-CM | POA: Insufficient documentation

## 2017-11-12 DIAGNOSIS — K219 Gastro-esophageal reflux disease without esophagitis: Secondary | ICD-10-CM | POA: Diagnosis not present

## 2017-11-13 DIAGNOSIS — S82891A Other fracture of right lower leg, initial encounter for closed fracture: Secondary | ICD-10-CM | POA: Diagnosis not present

## 2017-11-13 DIAGNOSIS — S82851A Displaced trimalleolar fracture of right lower leg, initial encounter for closed fracture: Secondary | ICD-10-CM | POA: Insufficient documentation

## 2017-11-13 DIAGNOSIS — I10 Essential (primary) hypertension: Secondary | ICD-10-CM | POA: Diagnosis not present

## 2017-11-14 ENCOUNTER — Other Ambulatory Visit: Payer: Self-pay | Admitting: Internal Medicine

## 2017-11-15 DIAGNOSIS — S82851D Displaced trimalleolar fracture of right lower leg, subsequent encounter for closed fracture with routine healing: Secondary | ICD-10-CM | POA: Diagnosis not present

## 2017-11-15 DIAGNOSIS — Z86718 Personal history of other venous thrombosis and embolism: Secondary | ICD-10-CM | POA: Diagnosis not present

## 2017-11-15 DIAGNOSIS — I10 Essential (primary) hypertension: Secondary | ICD-10-CM | POA: Diagnosis not present

## 2017-11-15 DIAGNOSIS — J45909 Unspecified asthma, uncomplicated: Secondary | ICD-10-CM | POA: Diagnosis not present

## 2017-11-15 DIAGNOSIS — E039 Hypothyroidism, unspecified: Secondary | ICD-10-CM | POA: Diagnosis not present

## 2017-11-15 DIAGNOSIS — Z9181 History of falling: Secondary | ICD-10-CM | POA: Diagnosis not present

## 2017-11-15 DIAGNOSIS — G4730 Sleep apnea, unspecified: Secondary | ICD-10-CM | POA: Diagnosis not present

## 2017-11-19 ENCOUNTER — Telehealth: Payer: Self-pay

## 2017-11-19 DIAGNOSIS — S82855A Nondisplaced trimalleolar fracture of left lower leg, initial encounter for closed fracture: Secondary | ICD-10-CM | POA: Diagnosis not present

## 2017-11-19 NOTE — Telephone Encounter (Signed)
[facility]  CALLED ASKING FOR VERBAL ORDER FOR PT AND OT EVAL AND TREAT PT  FOR 2 A WEEK FOR 3 WEEKS AND OT ONCE A WEEK FOR 1 WEEK AND 2 TIMES A WEEK 2 WEEK AND 1 FOR 1 WEEKS AS  PER HEATHER IS OK/NP

## 2017-11-20 DIAGNOSIS — I10 Essential (primary) hypertension: Secondary | ICD-10-CM | POA: Diagnosis not present

## 2017-11-20 DIAGNOSIS — G4730 Sleep apnea, unspecified: Secondary | ICD-10-CM | POA: Diagnosis not present

## 2017-11-20 DIAGNOSIS — Z9181 History of falling: Secondary | ICD-10-CM | POA: Diagnosis not present

## 2017-11-20 DIAGNOSIS — E039 Hypothyroidism, unspecified: Secondary | ICD-10-CM | POA: Diagnosis not present

## 2017-11-20 DIAGNOSIS — J45909 Unspecified asthma, uncomplicated: Secondary | ICD-10-CM | POA: Diagnosis not present

## 2017-11-20 DIAGNOSIS — S82851D Displaced trimalleolar fracture of right lower leg, subsequent encounter for closed fracture with routine healing: Secondary | ICD-10-CM | POA: Diagnosis not present

## 2017-11-20 DIAGNOSIS — Z86718 Personal history of other venous thrombosis and embolism: Secondary | ICD-10-CM | POA: Diagnosis not present

## 2017-11-21 DIAGNOSIS — G4730 Sleep apnea, unspecified: Secondary | ICD-10-CM | POA: Diagnosis not present

## 2017-11-21 DIAGNOSIS — E039 Hypothyroidism, unspecified: Secondary | ICD-10-CM | POA: Diagnosis not present

## 2017-11-21 DIAGNOSIS — Z9181 History of falling: Secondary | ICD-10-CM | POA: Diagnosis not present

## 2017-11-21 DIAGNOSIS — S82851D Displaced trimalleolar fracture of right lower leg, subsequent encounter for closed fracture with routine healing: Secondary | ICD-10-CM | POA: Diagnosis not present

## 2017-11-21 DIAGNOSIS — J45909 Unspecified asthma, uncomplicated: Secondary | ICD-10-CM | POA: Diagnosis not present

## 2017-11-21 DIAGNOSIS — I10 Essential (primary) hypertension: Secondary | ICD-10-CM | POA: Diagnosis not present

## 2017-11-21 DIAGNOSIS — Z86718 Personal history of other venous thrombosis and embolism: Secondary | ICD-10-CM | POA: Diagnosis not present

## 2017-11-22 DIAGNOSIS — G4730 Sleep apnea, unspecified: Secondary | ICD-10-CM | POA: Diagnosis not present

## 2017-11-22 DIAGNOSIS — I10 Essential (primary) hypertension: Secondary | ICD-10-CM | POA: Diagnosis not present

## 2017-11-22 DIAGNOSIS — E039 Hypothyroidism, unspecified: Secondary | ICD-10-CM | POA: Diagnosis not present

## 2017-11-22 DIAGNOSIS — J45909 Unspecified asthma, uncomplicated: Secondary | ICD-10-CM | POA: Diagnosis not present

## 2017-11-22 DIAGNOSIS — Z86718 Personal history of other venous thrombosis and embolism: Secondary | ICD-10-CM | POA: Diagnosis not present

## 2017-11-22 DIAGNOSIS — S82851D Displaced trimalleolar fracture of right lower leg, subsequent encounter for closed fracture with routine healing: Secondary | ICD-10-CM | POA: Diagnosis not present

## 2017-11-22 DIAGNOSIS — Z9181 History of falling: Secondary | ICD-10-CM | POA: Diagnosis not present

## 2017-11-23 DIAGNOSIS — Z9181 History of falling: Secondary | ICD-10-CM | POA: Diagnosis not present

## 2017-11-23 DIAGNOSIS — S82851D Displaced trimalleolar fracture of right lower leg, subsequent encounter for closed fracture with routine healing: Secondary | ICD-10-CM | POA: Diagnosis not present

## 2017-11-23 DIAGNOSIS — G4730 Sleep apnea, unspecified: Secondary | ICD-10-CM | POA: Diagnosis not present

## 2017-11-23 DIAGNOSIS — E039 Hypothyroidism, unspecified: Secondary | ICD-10-CM | POA: Diagnosis not present

## 2017-11-23 DIAGNOSIS — Z86718 Personal history of other venous thrombosis and embolism: Secondary | ICD-10-CM | POA: Diagnosis not present

## 2017-11-23 DIAGNOSIS — J45909 Unspecified asthma, uncomplicated: Secondary | ICD-10-CM | POA: Diagnosis not present

## 2017-11-23 DIAGNOSIS — I10 Essential (primary) hypertension: Secondary | ICD-10-CM | POA: Diagnosis not present

## 2017-11-26 DIAGNOSIS — G4733 Obstructive sleep apnea (adult) (pediatric): Secondary | ICD-10-CM | POA: Diagnosis not present

## 2017-11-28 ENCOUNTER — Ambulatory Visit: Payer: Self-pay | Admitting: Nurse Practitioner

## 2017-11-28 DIAGNOSIS — S82401P Unspecified fracture of shaft of right fibula, subsequent encounter for closed fracture with malunion: Secondary | ICD-10-CM | POA: Diagnosis not present

## 2017-11-28 DIAGNOSIS — S82402D Unspecified fracture of shaft of left fibula, subsequent encounter for closed fracture with routine healing: Secondary | ICD-10-CM | POA: Diagnosis not present

## 2017-11-28 DIAGNOSIS — S82891P Other fracture of right lower leg, subsequent encounter for closed fracture with malunion: Secondary | ICD-10-CM | POA: Diagnosis not present

## 2017-11-28 DIAGNOSIS — S82855D Nondisplaced trimalleolar fracture of left lower leg, subsequent encounter for closed fracture with routine healing: Secondary | ICD-10-CM | POA: Diagnosis not present

## 2017-11-28 DIAGNOSIS — S82852D Displaced trimalleolar fracture of left lower leg, subsequent encounter for closed fracture with routine healing: Secondary | ICD-10-CM | POA: Diagnosis not present

## 2017-11-29 DIAGNOSIS — I10 Essential (primary) hypertension: Secondary | ICD-10-CM | POA: Diagnosis not present

## 2017-11-29 DIAGNOSIS — J45909 Unspecified asthma, uncomplicated: Secondary | ICD-10-CM | POA: Diagnosis not present

## 2017-11-29 DIAGNOSIS — E039 Hypothyroidism, unspecified: Secondary | ICD-10-CM | POA: Diagnosis not present

## 2017-11-29 DIAGNOSIS — S82851D Displaced trimalleolar fracture of right lower leg, subsequent encounter for closed fracture with routine healing: Secondary | ICD-10-CM | POA: Diagnosis not present

## 2017-11-29 DIAGNOSIS — Z9181 History of falling: Secondary | ICD-10-CM | POA: Diagnosis not present

## 2017-11-29 DIAGNOSIS — G4730 Sleep apnea, unspecified: Secondary | ICD-10-CM | POA: Diagnosis not present

## 2017-11-29 DIAGNOSIS — Z86718 Personal history of other venous thrombosis and embolism: Secondary | ICD-10-CM | POA: Diagnosis not present

## 2017-11-30 DIAGNOSIS — G4730 Sleep apnea, unspecified: Secondary | ICD-10-CM | POA: Diagnosis not present

## 2017-11-30 DIAGNOSIS — E039 Hypothyroidism, unspecified: Secondary | ICD-10-CM | POA: Diagnosis not present

## 2017-11-30 DIAGNOSIS — J45909 Unspecified asthma, uncomplicated: Secondary | ICD-10-CM | POA: Diagnosis not present

## 2017-11-30 DIAGNOSIS — Z9181 History of falling: Secondary | ICD-10-CM | POA: Diagnosis not present

## 2017-11-30 DIAGNOSIS — Z86718 Personal history of other venous thrombosis and embolism: Secondary | ICD-10-CM | POA: Diagnosis not present

## 2017-11-30 DIAGNOSIS — S82851D Displaced trimalleolar fracture of right lower leg, subsequent encounter for closed fracture with routine healing: Secondary | ICD-10-CM | POA: Diagnosis not present

## 2017-11-30 DIAGNOSIS — I10 Essential (primary) hypertension: Secondary | ICD-10-CM | POA: Diagnosis not present

## 2017-12-01 DIAGNOSIS — G4730 Sleep apnea, unspecified: Secondary | ICD-10-CM | POA: Diagnosis not present

## 2017-12-01 DIAGNOSIS — Z86718 Personal history of other venous thrombosis and embolism: Secondary | ICD-10-CM | POA: Diagnosis not present

## 2017-12-01 DIAGNOSIS — E039 Hypothyroidism, unspecified: Secondary | ICD-10-CM | POA: Diagnosis not present

## 2017-12-01 DIAGNOSIS — I10 Essential (primary) hypertension: Secondary | ICD-10-CM | POA: Diagnosis not present

## 2017-12-01 DIAGNOSIS — Z9181 History of falling: Secondary | ICD-10-CM | POA: Diagnosis not present

## 2017-12-01 DIAGNOSIS — S82851D Displaced trimalleolar fracture of right lower leg, subsequent encounter for closed fracture with routine healing: Secondary | ICD-10-CM | POA: Diagnosis not present

## 2017-12-01 DIAGNOSIS — J45909 Unspecified asthma, uncomplicated: Secondary | ICD-10-CM | POA: Diagnosis not present

## 2017-12-03 DIAGNOSIS — G4730 Sleep apnea, unspecified: Secondary | ICD-10-CM | POA: Diagnosis not present

## 2017-12-03 DIAGNOSIS — E039 Hypothyroidism, unspecified: Secondary | ICD-10-CM | POA: Diagnosis not present

## 2017-12-03 DIAGNOSIS — Z9181 History of falling: Secondary | ICD-10-CM | POA: Diagnosis not present

## 2017-12-03 DIAGNOSIS — S82851D Displaced trimalleolar fracture of right lower leg, subsequent encounter for closed fracture with routine healing: Secondary | ICD-10-CM | POA: Diagnosis not present

## 2017-12-03 DIAGNOSIS — J45909 Unspecified asthma, uncomplicated: Secondary | ICD-10-CM | POA: Diagnosis not present

## 2017-12-03 DIAGNOSIS — I10 Essential (primary) hypertension: Secondary | ICD-10-CM | POA: Diagnosis not present

## 2017-12-03 DIAGNOSIS — Z86718 Personal history of other venous thrombosis and embolism: Secondary | ICD-10-CM | POA: Diagnosis not present

## 2017-12-04 DIAGNOSIS — Z86718 Personal history of other venous thrombosis and embolism: Secondary | ICD-10-CM | POA: Diagnosis not present

## 2017-12-04 DIAGNOSIS — J45909 Unspecified asthma, uncomplicated: Secondary | ICD-10-CM | POA: Diagnosis not present

## 2017-12-04 DIAGNOSIS — I10 Essential (primary) hypertension: Secondary | ICD-10-CM | POA: Diagnosis not present

## 2017-12-04 DIAGNOSIS — G4730 Sleep apnea, unspecified: Secondary | ICD-10-CM | POA: Diagnosis not present

## 2017-12-04 DIAGNOSIS — Z9181 History of falling: Secondary | ICD-10-CM | POA: Diagnosis not present

## 2017-12-04 DIAGNOSIS — S82851D Displaced trimalleolar fracture of right lower leg, subsequent encounter for closed fracture with routine healing: Secondary | ICD-10-CM | POA: Diagnosis not present

## 2017-12-04 DIAGNOSIS — E039 Hypothyroidism, unspecified: Secondary | ICD-10-CM | POA: Diagnosis not present

## 2017-12-05 DIAGNOSIS — Z86718 Personal history of other venous thrombosis and embolism: Secondary | ICD-10-CM | POA: Diagnosis not present

## 2017-12-05 DIAGNOSIS — G4730 Sleep apnea, unspecified: Secondary | ICD-10-CM | POA: Diagnosis not present

## 2017-12-05 DIAGNOSIS — Z9181 History of falling: Secondary | ICD-10-CM | POA: Diagnosis not present

## 2017-12-05 DIAGNOSIS — I10 Essential (primary) hypertension: Secondary | ICD-10-CM | POA: Diagnosis not present

## 2017-12-05 DIAGNOSIS — S82851D Displaced trimalleolar fracture of right lower leg, subsequent encounter for closed fracture with routine healing: Secondary | ICD-10-CM | POA: Diagnosis not present

## 2017-12-05 DIAGNOSIS — J45909 Unspecified asthma, uncomplicated: Secondary | ICD-10-CM | POA: Diagnosis not present

## 2017-12-05 DIAGNOSIS — E039 Hypothyroidism, unspecified: Secondary | ICD-10-CM | POA: Diagnosis not present

## 2017-12-12 DIAGNOSIS — S82891D Other fracture of right lower leg, subsequent encounter for closed fracture with routine healing: Secondary | ICD-10-CM | POA: Diagnosis not present

## 2017-12-12 DIAGNOSIS — S82851D Displaced trimalleolar fracture of right lower leg, subsequent encounter for closed fracture with routine healing: Secondary | ICD-10-CM | POA: Diagnosis not present

## 2018-01-02 DIAGNOSIS — M7989 Other specified soft tissue disorders: Secondary | ICD-10-CM | POA: Diagnosis not present

## 2018-01-02 DIAGNOSIS — S82452A Displaced comminuted fracture of shaft of left fibula, initial encounter for closed fracture: Secondary | ICD-10-CM | POA: Diagnosis not present

## 2018-01-02 DIAGNOSIS — M25572 Pain in left ankle and joints of left foot: Secondary | ICD-10-CM | POA: Diagnosis not present

## 2018-01-02 DIAGNOSIS — S99911D Unspecified injury of right ankle, subsequent encounter: Secondary | ICD-10-CM | POA: Diagnosis not present

## 2018-01-02 DIAGNOSIS — S82851D Displaced trimalleolar fracture of right lower leg, subsequent encounter for closed fracture with routine healing: Secondary | ICD-10-CM | POA: Diagnosis not present

## 2018-01-02 DIAGNOSIS — S8252XA Displaced fracture of medial malleolus of left tibia, initial encounter for closed fracture: Secondary | ICD-10-CM | POA: Diagnosis not present

## 2018-01-15 DIAGNOSIS — M7989 Other specified soft tissue disorders: Secondary | ICD-10-CM | POA: Diagnosis not present

## 2018-01-15 DIAGNOSIS — I872 Venous insufficiency (chronic) (peripheral): Secondary | ICD-10-CM | POA: Diagnosis not present

## 2018-01-15 DIAGNOSIS — I890 Lymphedema, not elsewhere classified: Secondary | ICD-10-CM | POA: Insufficient documentation

## 2018-01-25 ENCOUNTER — Other Ambulatory Visit: Payer: Self-pay | Admitting: Internal Medicine

## 2018-01-28 ENCOUNTER — Other Ambulatory Visit: Payer: Self-pay

## 2018-01-28 MED ORDER — POTASSIUM CHLORIDE CRYS ER 10 MEQ PO TBCR: EXTENDED_RELEASE_TABLET | ORAL | 1 refills | Status: DC

## 2018-01-30 DIAGNOSIS — M7989 Other specified soft tissue disorders: Secondary | ICD-10-CM | POA: Diagnosis not present

## 2018-01-30 DIAGNOSIS — R600 Localized edema: Secondary | ICD-10-CM | POA: Diagnosis not present

## 2018-01-30 DIAGNOSIS — S82851D Displaced trimalleolar fracture of right lower leg, subsequent encounter for closed fracture with routine healing: Secondary | ICD-10-CM | POA: Diagnosis not present

## 2018-01-30 DIAGNOSIS — S82891D Other fracture of right lower leg, subsequent encounter for closed fracture with routine healing: Secondary | ICD-10-CM | POA: Diagnosis not present

## 2018-02-01 ENCOUNTER — Encounter: Payer: Self-pay | Admitting: Nurse Practitioner

## 2018-02-01 ENCOUNTER — Other Ambulatory Visit: Payer: Self-pay | Admitting: Nurse Practitioner

## 2018-02-01 ENCOUNTER — Ambulatory Visit (INDEPENDENT_AMBULATORY_CARE_PROVIDER_SITE_OTHER): Payer: Medicare HMO | Admitting: Nurse Practitioner

## 2018-02-01 VITALS — BP 130/80 | HR 84 | Resp 16 | Ht 66.5 in | Wt 205.0 lb

## 2018-02-01 DIAGNOSIS — R609 Edema, unspecified: Secondary | ICD-10-CM

## 2018-02-01 DIAGNOSIS — R601 Generalized edema: Secondary | ICD-10-CM | POA: Diagnosis not present

## 2018-02-01 DIAGNOSIS — I10 Essential (primary) hypertension: Secondary | ICD-10-CM | POA: Diagnosis not present

## 2018-02-01 DIAGNOSIS — R0602 Shortness of breath: Secondary | ICD-10-CM | POA: Diagnosis not present

## 2018-02-01 DIAGNOSIS — Z86718 Personal history of other venous thrombosis and embolism: Secondary | ICD-10-CM | POA: Diagnosis not present

## 2018-02-01 MED ORDER — ELIQUIS 5 MG PO TABS: 5.0000 mg | ORAL_TABLET | Freq: Two times a day (BID) | ORAL | 3 refills | Status: DC

## 2018-02-01 MED ORDER — FUROSEMIDE 20 MG PO TABS: 40.0000 mg | ORAL_TABLET | Freq: Every day | ORAL | 3 refills | Status: DC

## 2018-02-01 NOTE — Progress Notes (Signed)
[facility] [place], [place] [address]  Internal MEDICINE  Office Visit Note  Patient Name: Nancy Moyer  [id number]  [id number]  Date of Service: 02/01/2018  Chief Complaint  Patient presents with  . Shortness of Breath    worsening    The patient is here for routine follow up exam. She is having severe swelling in bilateral lower legs. Has actually spread up into the abdomen. She has had increased chest discomfort and shortness of breath, especially with exertion. Is seeing vascular provider. Did have doppler study done, checking legs for clots, which was negative. 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 Currently on eliquis 5mg  twice daily.    Shortness of Breath  This is a chronic problem. The current episode started 1 to 4 weeks ago. The problem occurs intermittently. The problem has been unchanged. Associated symptoms include chest pain, claudication and leg swelling. Pertinent negatives include no abdominal pain, neck pain, rash, rhinorrhea, sore throat or vomiting. The symptoms are aggravated by exercise. Risk factors include recent leg injury. She has tried rest for the symptoms. The treatment provided mild relief. Her past medical history is significant for DVT and PE.    Pt is here for routine follow up.    Current Medication: Outpatient Encounter Medications as of 02/01/2018  Medication Sig  . albuterol (PROVENTIL HFA) 108 (90 Base) MCG/ACT inhaler Inhale into the lungs.  Marland Kitchen alendronate (FOSAMAX) 70 MG tablet Take 70 mg by mouth once a week. Take with a full glass of water on an empty stomach.  . Calcium Carbonate-Vitamin D 600-400 MG-UNIT tablet Take by mouth.  . carvedilol (COREG) 6.25 MG tablet   . DULoxetine (CYMBALTA) 30 MG capsule Take 30 mg by mouth daily.  Marland Kitchen ELIQUIS 5 MG TABS tablet Take 1 tablet (5 mg total) by mouth 2 (two) times daily.  . Fluticasone-Salmeterol (ADVAIR) 100-50 MCG/DOSE AEPB Inhale 1 puff into the lungs 2 (two)  times daily.  . furosemide (LASIX) 20 MG tablet Take 2 tablets (40 mg total) by mouth daily.  Marland Kitchen levothyroxine (SYNTHROID, LEVOTHROID) 100 MCG tablet Take by mouth.  Marland Kitchen lisinopril (PRINIVIL,ZESTRIL) 10 MG tablet   . lovastatin (MEVACOR) 10 MG tablet   . meloxicam (MOBIC) 15 MG tablet TAKE 1 TABLET EVERY DAY  . mometasone (NASONEX) 50 MCG/ACT nasal spray Place into the nose.  . montelukast (SINGULAIR) 10 MG tablet Take 10 mg by mouth.  . Multiple Vitamin (MULTI-VITAMINS) TABS Take by mouth.  Marland Kitchen omeprazole (PRILOSEC) 20 MG capsule Take 20 mg by mouth.  . potassium chloride (K-DUR,KLOR-CON) 10 MEQ tablet Take 1 tab po M ,W and friday  . potassium chloride (K-DUR,KLOR-CON) 10 MEQ tablet Take by mouth.  . traMADol (ULTRAM) 50 MG tablet Take 50 mg by mouth.  . triamcinolone (KENALOG) 0.025 % cream   . [DISCONTINUED] ELIQUIS 5 MG TABS tablet   . [DISCONTINUED] furosemide (LASIX) 20 MG tablet Take 20 mg by mouth.  . levofloxacin (LEVAQUIN) 500 MG tablet TAKE 1 TABLET EVERY DAY  FOR  7  DAYS (Patient not taking: Reported on 02/01/2018)  . methylPREDNISolone (MEDROL DOSEPAK) 4 MG TBPK tablet TAKE AS PRESCRIBED FOR 6 DAYS (Patient not taking: Reported on 02/01/2018)   No facility-administered encounter medications on file as of 02/01/2018.     Surgical History: Past Surgical History:  Procedure Laterality Date  . DILATATION & CURETTAGE/HYSTEROSCOPY WITH MYOSURE    . ENDOMETRIAL BIOPSY    . left malleolar fracture    .  uterine biopsy    . VARICOSE VEIN SURGERY      Medical History: Past Medical History:  Diagnosis Date  . Deep venous thrombosis ([place])   . Hyperlipidemia   . Hypertension   . Pulmonary embolism ([place])   . Thyroid disease     Family History: Family History  Problem Relation Age of Onset  . Diabetes Father   . Lung cancer Brother   . Alcohol abuse Maternal Grandmother   . Liver cancer Maternal Grandmother   . Diabetes Paternal Grandfather     Social History   Socioeconomic  History  . Marital status: Widowed    Spouse name: Not on file  . Number of children: Not on file  . Years of education: Not on file  . Highest education level: Not on file  Social Needs  . Financial resource strain: Not on file  . Food insecurity - worry: Not on file  . Food insecurity - inability: Not on file  . Transportation needs - medical: Not on file  . Transportation needs - non-medical: Not on file  Occupational History  . Not on file  Tobacco Use  . Smoking status: Never Smoker  . Smokeless tobacco: Current User  Substance and Sexual Activity  . Alcohol use: No    Frequency: Never  . Drug use: No  . Sexual activity: Not on file  Other Topics Concern  . Not on file  Social History Narrative  . Not on file      Review of Systems  Constitutional: Positive for activity change and fatigue. Negative for chills and unexpected weight change.  HENT: Negative for congestion, postnasal drip, rhinorrhea, sneezing and sore throat.   Eyes: Negative.  Negative for redness.  Respiratory: Positive for chest tightness and shortness of breath. Negative for cough.   Cardiovascular: Positive for chest pain, claudication and leg swelling. Negative for palpitations.  Gastrointestinal: Negative for abdominal pain, constipation, diarrhea, nausea and vomiting.  Endocrine:       Well managed hypothyroid.  Genitourinary: Negative for dysuria and frequency.  Musculoskeletal: Positive for arthralgias and myalgias. Negative for back pain, joint swelling and neck pain.  Skin: Negative for rash.  Allergic/Immunologic: Negative for environmental allergies.  Neurological: Positive for dizziness and weakness. Negative for tremors and numbness.  Hematological: Negative for adenopathy. Does not bruise/bleed easily.  Psychiatric/Behavioral: Negative for behavioral problems (Depression), sleep disturbance and suicidal ideas. The patient is not nervous/anxious.     Today's Vitals   02/01/18 0838   BP: 130/80  Pulse: 84  Resp: 16  SpO2: 97%  Weight: 205 lb (93 kg)  Height: 5' 6.5" (1.689 m)    Physical Exam  Constitutional: She is oriented to person, place, and time. She appears well-developed and well-nourished. No distress.  HENT:  Head: Normocephalic and atraumatic.  Mouth/Throat: Oropharynx is clear and moist. No oropharyngeal exudate.  Eyes: EOM are normal. Pupils are equal, round, and reactive to light.  Neck: Normal range of motion. Neck supple. No JVD present. No tracheal deviation present. No thyromegaly present.  Cardiovascular: Normal rate. An irregular rhythm present. Exam reveals no gallop and no friction rub.  Murmur heard.  Systolic murmur is present with a grade of 2/6. There is 2+edema, bilateral lower extremities which are both tender to palpate.   Pulmonary/Chest: Effort normal and breath sounds normal. No respiratory distress. She has no wheezes. She has no rales. She exhibits no tenderness.  Abdominal: Soft. Bowel sounds are normal. There is no tenderness.  Musculoskeletal:  Right lower leg is currently in immobilization boot. There is some swelling present in both lower legs.   Lymphadenopathy:    She has no cervical adenopathy.  Neurological: She is alert and oriented to person, place, and time. No cranial nerve deficit.  Skin: Skin is warm and dry. She is not diaphoretic.  Psychiatric: She has a normal mood and affect. Her behavior is normal. Judgment and thought content normal.  Nursing note and vitals reviewed.   Assessment/Plan: 1. Shortness of breath Worsening and worse with exertion. History  Check labs, CMP, CBC, and BNP.  - CT Chest W Contrast; Future - ECHOCARDIOGRAM COMPLETE; Future  2. History of DVT (deep vein thrombosis) - ELIQUIS 5 MG TABS tablet; Take 1 tablet (5 mg total) by mouth 2 (two) times daily.  Dispense: 60 tablet; Refill: 3 Advised patient to continue with scheduled visits with vein and vascular.   3. Edema, unspecified  type - furosemide (LASIX) 20 MG tablet; Take 2 tablets (40 mg total) by mouth daily.  Dispense: 60 tablet; Refill: 3  4. Essential hypertension Stable. Continue bp medications as prescribed.   General Counseling: Dorleen verbalizes understanding of the findings of todays visit and agrees with plan of treatment. I have discussed any further diagnostic evaluation that may be needed or ordered today. We also reviewed her medications today. she has been encouraged to call the office with any questions or concerns that should arise related to todays visit.   This patient was seen by Leretha Pol, FNP- C in Collaboration with Dr Lavera Guise as a part of collaborative care agreement    Orders Placed This Encounter  Procedures  . CT Chest W Contrast  . ECHOCARDIOGRAM COMPLETE    Meds ordered this encounter  Medications  . ELIQUIS 5 MG TABS tablet    Sig: Take 1 tablet (5 mg total) by mouth 2 (two) times daily.    Dispense:  60 tablet    Refill:  3    Order Specific Question:   Supervising Provider    Answer:   Lavera Guise [[id number]]  . furosemide (LASIX) 20 MG tablet    Sig: Take 2 tablets (40 mg total) by mouth daily.    Dispense:  60 tablet    Refill:  3    Increase to 2 tablets daily for next few days, then go back to 20mg  daily    Order Specific Question:   Supervising Provider    Answer:   Lavera Guise [[id number]]    Time spent: 25 Minutes    Dr Lavera Guise Internal medicine

## 2018-02-02 LAB — BASIC METABOLIC PANEL
BUN / CREAT RATIO: 15 (ref 12–28)
BUN: 14 mg/dL (ref 8–27)
CALCIUM: 9.4 mg/dL (ref 8.7–10.3)
CHLORIDE: 103 mmol/L (ref 96–106)
CO2: 27 mmol/L (ref 20–29)
Creatinine, Ser: 0.96 mg/dL (ref 0.57–1.00)
GFR calc non Af Amer: 56 mL/min/{1.73_m2} — ABNORMAL LOW (ref >59)
GFR, EST AFRICAN AMERICAN: 65 mL/min/{1.73_m2} (ref >59)
GLUCOSE: 120 mg/dL — AB (ref 65–99)
POTASSIUM: 4.7 mmol/L (ref 3.5–5.2)
Sodium: 144 mmol/L (ref 134–144)

## 2018-02-02 LAB — CBC
Hematocrit: 47.2 % — ABNORMAL HIGH (ref 34.0–46.6)
Hemoglobin: 15.3 g/dL (ref 11.1–15.9)
MCH: 30.2 pg (ref 26.6–33.0)
MCHC: 32.4 g/dL (ref 31.5–35.7)
MCV: 93 fL (ref 79–97)
PLATELETS: 230 10*3/uL (ref 150–379)
RBC: 5.07 x10E6/uL (ref 3.77–5.28)
RDW: 14.6 % (ref 12.3–15.4)
WBC: 6.5 10*3/uL (ref 3.4–10.8)

## 2018-02-02 LAB — BRAIN NATRIURETIC PEPTIDE: BNP: 63.6 pg/mL (ref 0.0–100.0)

## 2018-02-11 ENCOUNTER — Ambulatory Visit: Payer: Medicare HMO

## 2018-02-11 DIAGNOSIS — R0602 Shortness of breath: Secondary | ICD-10-CM | POA: Diagnosis not present

## 2018-02-13 ENCOUNTER — Telehealth: Payer: Self-pay

## 2018-02-13 NOTE — Telephone Encounter (Signed)
Called [facility] to get out of pocket spending for 2019 for patient assistance.Nancy Moyer

## 2018-02-20 ENCOUNTER — Telehealth: Payer: Self-pay

## 2018-02-20 ENCOUNTER — Other Ambulatory Visit: Payer: Self-pay | Admitting: Nurse Practitioner

## 2018-02-20 DIAGNOSIS — N3289 Other specified disorders of bladder: Secondary | ICD-10-CM

## 2018-02-20 DIAGNOSIS — S82851D Displaced trimalleolar fracture of right lower leg, subsequent encounter for closed fracture with routine healing: Secondary | ICD-10-CM | POA: Diagnosis not present

## 2018-02-20 DIAGNOSIS — N390 Urinary tract infection, site not specified: Secondary | ICD-10-CM

## 2018-02-20 DIAGNOSIS — S82891D Other fracture of right lower leg, subsequent encounter for closed fracture with routine healing: Secondary | ICD-10-CM | POA: Diagnosis not present

## 2018-02-20 MED ORDER — NITROFURANTOIN MONOHYD MACRO 100 MG PO CAPS: 100.0000 mg | ORAL_CAPSULE | Freq: Two times a day (BID) | ORAL | 0 refills | Status: DC

## 2018-02-20 MED ORDER — PHENAZOPYRIDINE HCL 200 MG PO TABS: 200.0000 mg | ORAL_TABLET | Freq: Three times a day (TID) | ORAL | 0 refills | Status: DC | PRN

## 2018-02-20 NOTE — Telephone Encounter (Signed)
Patient has been advised that she is scheduled for a CT Chest on 3/29/169 @ 11:00 KP/Titania

## 2018-02-20 NOTE — Progress Notes (Signed)
Patient called with symptoms of uti. Sent macrobid 100mg  bid for 10 days and pyridium 200mg  TID as needed for bladder pain/spasme. Both [facility]s [facility] to [facility] [place].

## 2018-02-25 ENCOUNTER — Other Ambulatory Visit: Payer: Self-pay

## 2018-02-25 ENCOUNTER — Other Ambulatory Visit: Payer: Self-pay | Admitting: Internal Medicine

## 2018-02-25 MED ORDER — MONTELUKAST SODIUM 10 MG PO TABS: 10.0000 mg | ORAL_TABLET | Freq: Every day | ORAL | 1 refills | Status: DC

## 2018-02-26 DIAGNOSIS — I890 Lymphedema, not elsewhere classified: Secondary | ICD-10-CM | POA: Diagnosis not present

## 2018-02-26 DIAGNOSIS — E039 Hypothyroidism, unspecified: Secondary | ICD-10-CM | POA: Diagnosis not present

## 2018-02-26 DIAGNOSIS — I872 Venous insufficiency (chronic) (peripheral): Secondary | ICD-10-CM | POA: Insufficient documentation

## 2018-02-26 DIAGNOSIS — R609 Edema, unspecified: Secondary | ICD-10-CM | POA: Diagnosis not present

## 2018-02-26 DIAGNOSIS — L819 Disorder of pigmentation, unspecified: Secondary | ICD-10-CM | POA: Diagnosis not present

## 2018-02-26 DIAGNOSIS — Z86718 Personal history of other venous thrombosis and embolism: Secondary | ICD-10-CM | POA: Diagnosis not present

## 2018-02-26 DIAGNOSIS — I8392 Asymptomatic varicose veins of left lower extremity: Secondary | ICD-10-CM | POA: Diagnosis not present

## 2018-02-26 DIAGNOSIS — Z7901 Long term (current) use of anticoagulants: Secondary | ICD-10-CM | POA: Diagnosis not present

## 2018-02-26 DIAGNOSIS — M1990 Unspecified osteoarthritis, unspecified site: Secondary | ICD-10-CM | POA: Diagnosis not present

## 2018-02-26 DIAGNOSIS — I10 Essential (primary) hypertension: Secondary | ICD-10-CM | POA: Diagnosis not present

## 2018-02-26 DIAGNOSIS — M7989 Other specified soft tissue disorders: Secondary | ICD-10-CM | POA: Diagnosis not present

## 2018-02-28 ENCOUNTER — Other Ambulatory Visit: Payer: Self-pay | Admitting: Nurse Practitioner

## 2018-02-28 ENCOUNTER — Telehealth: Payer: Self-pay

## 2018-02-28 DIAGNOSIS — R0602 Shortness of breath: Secondary | ICD-10-CM

## 2018-02-28 NOTE — Telephone Encounter (Signed)
Patient's daughter Lynelle Smoke) has notified that her mom's procedure ct chest w/ contrast has been changed to chest wo contrast due to patient's allergy to iodine and benedry.Titania

## 2018-03-01 ENCOUNTER — Telehealth: Payer: Self-pay

## 2018-03-01 ENCOUNTER — Ambulatory Visit: Admission: RE | Admit: 2018-03-01 | Payer: Self-pay | Source: Ambulatory Visit

## 2018-03-01 ENCOUNTER — Ambulatory Visit: Admission: RE | Admit: 2018-03-01 | Payer: Medicare HMO | Source: Ambulatory Visit

## 2018-03-01 NOTE — Telephone Encounter (Signed)
Patient's daughter Nancy Moyer has been advised that her mom's procedure has been changed to VQ lung scan instead of ct chest wo.patient will need a chest xray also prior to scan.[name]

## 2018-03-02 DIAGNOSIS — M1990 Unspecified osteoarthritis, unspecified site: Secondary | ICD-10-CM | POA: Diagnosis not present

## 2018-03-02 DIAGNOSIS — I739 Peripheral vascular disease, unspecified: Secondary | ICD-10-CM | POA: Diagnosis not present

## 2018-03-02 DIAGNOSIS — I872 Venous insufficiency (chronic) (peripheral): Secondary | ICD-10-CM | POA: Diagnosis not present

## 2018-03-02 DIAGNOSIS — J45909 Unspecified asthma, uncomplicated: Secondary | ICD-10-CM | POA: Diagnosis not present

## 2018-03-02 DIAGNOSIS — I890 Lymphedema, not elsewhere classified: Secondary | ICD-10-CM | POA: Diagnosis not present

## 2018-03-02 DIAGNOSIS — I10 Essential (primary) hypertension: Secondary | ICD-10-CM | POA: Diagnosis not present

## 2018-03-02 DIAGNOSIS — E039 Hypothyroidism, unspecified: Secondary | ICD-10-CM | POA: Diagnosis not present

## 2018-03-02 DIAGNOSIS — S82851D Displaced trimalleolar fracture of right lower leg, subsequent encounter for closed fracture with routine healing: Secondary | ICD-10-CM | POA: Diagnosis not present

## 2018-03-02 DIAGNOSIS — G4730 Sleep apnea, unspecified: Secondary | ICD-10-CM | POA: Diagnosis not present

## 2018-03-04 ENCOUNTER — Ambulatory Visit: Payer: Self-pay | Admitting: Nurse Practitioner

## 2018-03-05 DIAGNOSIS — I890 Lymphedema, not elsewhere classified: Secondary | ICD-10-CM | POA: Diagnosis not present

## 2018-03-05 DIAGNOSIS — J45909 Unspecified asthma, uncomplicated: Secondary | ICD-10-CM | POA: Diagnosis not present

## 2018-03-05 DIAGNOSIS — G4730 Sleep apnea, unspecified: Secondary | ICD-10-CM | POA: Diagnosis not present

## 2018-03-05 DIAGNOSIS — I10 Essential (primary) hypertension: Secondary | ICD-10-CM | POA: Diagnosis not present

## 2018-03-05 DIAGNOSIS — E039 Hypothyroidism, unspecified: Secondary | ICD-10-CM | POA: Diagnosis not present

## 2018-03-05 DIAGNOSIS — M1990 Unspecified osteoarthritis, unspecified site: Secondary | ICD-10-CM | POA: Diagnosis not present

## 2018-03-05 DIAGNOSIS — S82851D Displaced trimalleolar fracture of right lower leg, subsequent encounter for closed fracture with routine healing: Secondary | ICD-10-CM | POA: Diagnosis not present

## 2018-03-05 DIAGNOSIS — I872 Venous insufficiency (chronic) (peripheral): Secondary | ICD-10-CM | POA: Diagnosis not present

## 2018-03-05 DIAGNOSIS — I739 Peripheral vascular disease, unspecified: Secondary | ICD-10-CM | POA: Diagnosis not present

## 2018-03-07 ENCOUNTER — Ambulatory Visit
Admission: RE | Admit: 2018-03-07 | Discharge: 2018-03-07 | Disposition: A | Discharge status: Home / Self Care | Payer: Medicare HMO | Source: Ambulatory Visit | Attending: Nurse Practitioner | Admitting: Nurse Practitioner

## 2018-03-07 ENCOUNTER — Encounter
Admission: RE | Admit: 2018-03-07 | Discharge: 2018-03-07 | Disposition: A | Discharge status: Home / Self Care | Payer: Medicare HMO | Source: Ambulatory Visit | Attending: Nurse Practitioner | Admitting: Nurse Practitioner

## 2018-03-07 DIAGNOSIS — R0602 Shortness of breath: Secondary | ICD-10-CM

## 2018-03-07 DIAGNOSIS — I517 Cardiomegaly: Secondary | ICD-10-CM | POA: Diagnosis not present

## 2018-03-07 DIAGNOSIS — R918 Other nonspecific abnormal finding of lung field: Secondary | ICD-10-CM | POA: Diagnosis not present

## 2018-03-07 MED ORDER — TECHNETIUM TO 99M ALBUMIN AGGREGATED
4.2300 | Freq: Once | INTRAVENOUS | Status: AC | PRN
  Administered 2018-03-07: 4.23 via INTRAVENOUS

## 2018-03-07 MED ORDER — TECHNETIUM TC 99M DIETHYLENETRIAME-PENTAACETIC ACID
33.6100 | Freq: Once | INTRAVENOUS | Status: AC | PRN
  Administered 2018-03-07: 33.61 via RESPIRATORY_TRACT

## 2018-03-08 ENCOUNTER — Telehealth: Payer: Self-pay | Admitting: Nurse Practitioner

## 2018-03-08 DIAGNOSIS — I739 Peripheral vascular disease, unspecified: Secondary | ICD-10-CM | POA: Diagnosis not present

## 2018-03-08 DIAGNOSIS — M1990 Unspecified osteoarthritis, unspecified site: Secondary | ICD-10-CM | POA: Diagnosis not present

## 2018-03-08 DIAGNOSIS — I10 Essential (primary) hypertension: Secondary | ICD-10-CM | POA: Diagnosis not present

## 2018-03-08 DIAGNOSIS — I890 Lymphedema, not elsewhere classified: Secondary | ICD-10-CM | POA: Diagnosis not present

## 2018-03-08 DIAGNOSIS — E039 Hypothyroidism, unspecified: Secondary | ICD-10-CM | POA: Diagnosis not present

## 2018-03-08 DIAGNOSIS — I872 Venous insufficiency (chronic) (peripheral): Secondary | ICD-10-CM | POA: Diagnosis not present

## 2018-03-08 DIAGNOSIS — G4730 Sleep apnea, unspecified: Secondary | ICD-10-CM | POA: Diagnosis not present

## 2018-03-08 DIAGNOSIS — S82851D Displaced trimalleolar fracture of right lower leg, subsequent encounter for closed fracture with routine healing: Secondary | ICD-10-CM | POA: Diagnosis not present

## 2018-03-08 DIAGNOSIS — J45909 Unspecified asthma, uncomplicated: Secondary | ICD-10-CM | POA: Diagnosis not present

## 2018-03-08 NOTE — Telephone Encounter (Signed)
PT WAS CALLED AND NOTIFIED OF NORMAL CHEST XRAY

## 2018-03-08 NOTE — Telephone Encounter (Signed)
-----   Message from Ronnell Freshwater, NP sent at 03/07/2018  5:49 PM EDT ----- Please let the patient know that her chest x-ray and scan for blood clot in the lungs were both ok. No evidence of pneumonia or infection. No blood clot evident. thanks

## 2018-03-12 DIAGNOSIS — M1990 Unspecified osteoarthritis, unspecified site: Secondary | ICD-10-CM | POA: Diagnosis not present

## 2018-03-12 DIAGNOSIS — S82851D Displaced trimalleolar fracture of right lower leg, subsequent encounter for closed fracture with routine healing: Secondary | ICD-10-CM | POA: Diagnosis not present

## 2018-03-12 DIAGNOSIS — I10 Essential (primary) hypertension: Secondary | ICD-10-CM | POA: Diagnosis not present

## 2018-03-12 DIAGNOSIS — I890 Lymphedema, not elsewhere classified: Secondary | ICD-10-CM | POA: Diagnosis not present

## 2018-03-12 DIAGNOSIS — J45909 Unspecified asthma, uncomplicated: Secondary | ICD-10-CM | POA: Diagnosis not present

## 2018-03-12 DIAGNOSIS — I872 Venous insufficiency (chronic) (peripheral): Secondary | ICD-10-CM | POA: Diagnosis not present

## 2018-03-12 DIAGNOSIS — E039 Hypothyroidism, unspecified: Secondary | ICD-10-CM | POA: Diagnosis not present

## 2018-03-12 DIAGNOSIS — G4730 Sleep apnea, unspecified: Secondary | ICD-10-CM | POA: Diagnosis not present

## 2018-03-12 DIAGNOSIS — I739 Peripheral vascular disease, unspecified: Secondary | ICD-10-CM | POA: Diagnosis not present

## 2018-03-13 ENCOUNTER — Other Ambulatory Visit: Payer: Self-pay | Admitting: Internal Medicine

## 2018-03-14 DIAGNOSIS — J45909 Unspecified asthma, uncomplicated: Secondary | ICD-10-CM | POA: Diagnosis not present

## 2018-03-14 DIAGNOSIS — I872 Venous insufficiency (chronic) (peripheral): Secondary | ICD-10-CM | POA: Diagnosis not present

## 2018-03-14 DIAGNOSIS — E039 Hypothyroidism, unspecified: Secondary | ICD-10-CM | POA: Diagnosis not present

## 2018-03-14 DIAGNOSIS — I10 Essential (primary) hypertension: Secondary | ICD-10-CM | POA: Diagnosis not present

## 2018-03-14 DIAGNOSIS — S82851D Displaced trimalleolar fracture of right lower leg, subsequent encounter for closed fracture with routine healing: Secondary | ICD-10-CM | POA: Diagnosis not present

## 2018-03-14 DIAGNOSIS — I739 Peripheral vascular disease, unspecified: Secondary | ICD-10-CM | POA: Diagnosis not present

## 2018-03-14 DIAGNOSIS — M1990 Unspecified osteoarthritis, unspecified site: Secondary | ICD-10-CM | POA: Diagnosis not present

## 2018-03-14 DIAGNOSIS — G4730 Sleep apnea, unspecified: Secondary | ICD-10-CM | POA: Diagnosis not present

## 2018-03-14 DIAGNOSIS — I890 Lymphedema, not elsewhere classified: Secondary | ICD-10-CM | POA: Diagnosis not present

## 2018-03-19 DIAGNOSIS — G4730 Sleep apnea, unspecified: Secondary | ICD-10-CM | POA: Diagnosis not present

## 2018-03-19 DIAGNOSIS — I10 Essential (primary) hypertension: Secondary | ICD-10-CM | POA: Diagnosis not present

## 2018-03-19 DIAGNOSIS — I872 Venous insufficiency (chronic) (peripheral): Secondary | ICD-10-CM | POA: Diagnosis not present

## 2018-03-19 DIAGNOSIS — J45909 Unspecified asthma, uncomplicated: Secondary | ICD-10-CM | POA: Diagnosis not present

## 2018-03-19 DIAGNOSIS — M1990 Unspecified osteoarthritis, unspecified site: Secondary | ICD-10-CM | POA: Diagnosis not present

## 2018-03-19 DIAGNOSIS — I890 Lymphedema, not elsewhere classified: Secondary | ICD-10-CM | POA: Diagnosis not present

## 2018-03-19 DIAGNOSIS — I739 Peripheral vascular disease, unspecified: Secondary | ICD-10-CM | POA: Diagnosis not present

## 2018-03-19 DIAGNOSIS — S82851D Displaced trimalleolar fracture of right lower leg, subsequent encounter for closed fracture with routine healing: Secondary | ICD-10-CM | POA: Diagnosis not present

## 2018-03-19 DIAGNOSIS — E039 Hypothyroidism, unspecified: Secondary | ICD-10-CM | POA: Diagnosis not present

## 2018-03-21 ENCOUNTER — Encounter: Payer: Self-pay | Admitting: Nurse Practitioner

## 2018-03-21 ENCOUNTER — Ambulatory Visit (INDEPENDENT_AMBULATORY_CARE_PROVIDER_SITE_OTHER): Payer: Medicare HMO | Admitting: Nurse Practitioner

## 2018-03-21 VITALS — BP 152/91 | HR 68 | Resp 16 | Ht 66.5 in | Wt 206.4 lb

## 2018-03-21 DIAGNOSIS — J0100 Acute maxillary sinusitis, unspecified: Secondary | ICD-10-CM | POA: Diagnosis not present

## 2018-03-21 DIAGNOSIS — R0602 Shortness of breath: Secondary | ICD-10-CM | POA: Diagnosis not present

## 2018-03-21 DIAGNOSIS — R609 Edema, unspecified: Secondary | ICD-10-CM

## 2018-03-21 DIAGNOSIS — I10 Essential (primary) hypertension: Secondary | ICD-10-CM | POA: Diagnosis not present

## 2018-03-21 DIAGNOSIS — E039 Hypothyroidism, unspecified: Secondary | ICD-10-CM

## 2018-03-21 MED ORDER — AZITHROMYCIN 250 MG PO TABS
ORAL_TABLET | ORAL | 0 refills | Status: DC
Start: 2018-03-21 — End: 2018-04-18

## 2018-03-21 NOTE — Progress Notes (Signed)
[facility] [place], [place] [address]  Internal MEDICINE  Office Visit Note  Patient Name: Nancy Moyer  [id number]  [id number]  Date of Service: 04/10/2018    Pt is here for routine follow up.   Chief Complaint  Patient presents with  . Follow-up    BP has been elevated since last saturday. thinks something is going on with sinuses, face around the nasal area sore.  . lab results    VQ SCAN  and ECHO    The patient is here for routine follow up exam. She is having severe swelling in bilateral lower legs. Has actually spread up into the abdomen. She has had increased chest discomfort and shortness of breath, especially with exertion. Is seeing vascular provider. Did have doppler study done, checking legs for clots, which was negative. 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 Currently on eliquis 5mg  twice daily.   Shortness of Breath  This is a chronic problem. The current episode started 1 to 4 weeks ago. The problem occurs intermittently. The problem has been unchanged. Associated symptoms include chest pain, claudication, leg swelling, rhinorrhea and a sore throat. Pertinent negatives include no abdominal pain, neck pain, rash or vomiting. The symptoms are aggravated by exercise (going outside with increased pollen). Risk factors include recent leg injury. She has tried rest for the symptoms. The treatment provided mild relief. Her past medical history is significant for DVT and PE.      Current Medication: Outpatient Encounter Medications as of 03/21/2018  Medication Sig  . albuterol (PROVENTIL HFA) 108 (90 Base) MCG/ACT inhaler Inhale into the lungs.  Marland Kitchen alendronate (FOSAMAX) 70 MG tablet Take 70 mg by mouth once a week. Take with a full glass of water on an empty stomach.  . Calcium Carbonate-Vitamin D 600-400 MG-UNIT tablet Take by mouth.  . carvedilol (COREG) 6.25 MG tablet   . DULoxetine (CYMBALTA) 30 MG capsule Take 30 mg by mouth daily.   . Fluticasone-Salmeterol (ADVAIR) 100-50 MCG/DOSE AEPB Inhale 1 puff into the lungs 2 (two) times daily.  . furosemide (LASIX) 20 MG tablet Take 2 tablets (40 mg total) by mouth daily.  Marland Kitchen levofloxacin (LEVAQUIN) 500 MG tablet TAKE 1 TABLET EVERY DAY  FOR  7  DAYS  . levothyroxine (SYNTHROID, LEVOTHROID) 100 MCG tablet Take by mouth.  Marland Kitchen lisinopril (PRINIVIL,ZESTRIL) 10 MG tablet   . lovastatin (MEVACOR) 10 MG tablet TAKE 1 TABLET AT BEDTIME FOR CHOLESTEROL  . meloxicam (MOBIC) 15 MG tablet TAKE 1 TABLET EVERY DAY  . methylPREDNISolone (MEDROL DOSEPAK) 4 MG TBPK tablet TAKE AS PRESCRIBED FOR 6 DAYS  . mometasone (NASONEX) 50 MCG/ACT nasal spray Place into the nose.  . montelukast (SINGULAIR) 10 MG tablet Take 1 tablet (10 mg total) by mouth daily.  . Multiple Vitamin (MULTI-VITAMINS) TABS Take by mouth.  . nitrofurantoin, macrocrystal-monohydrate, (MACROBID) 100 MG capsule Take 1 capsule (100 mg total) by mouth 2 (two) times daily.  Marland Kitchen omeprazole (PRILOSEC) 20 MG capsule Take 20 mg by mouth.  . phenazopyridine (PYRIDIUM) 200 MG tablet Take 1 tablet (200 mg total) by mouth 3 (three) times daily as needed for pain.  . potassium chloride (K-DUR,KLOR-CON) 10 MEQ tablet Take 1 tab po M ,W and friday  . potassium chloride (K-DUR,KLOR-CON) 10 MEQ tablet Take by mouth.  . traMADol (ULTRAM) 50 MG tablet Take 50 mg by mouth.  . triamcinolone (KENALOG) 0.025 % cream   . [DISCONTINUED] omeprazole (PRILOSEC) 20 MG capsule TAKE  1 CAPSULE EVERY DAY  . azithromycin (ZITHROMAX) 250 MG tablet z-pack - take as directed for 5 days  . ELIQUIS 5 MG TABS tablet Take 1 tablet (5 mg total) by mouth 2 (two) times daily. (Patient not taking: Reported on 03/21/2018)   No facility-administered encounter medications on file as of 03/21/2018.     Surgical History: Past Surgical History:  Procedure Laterality Date  . DILATATION & CURETTAGE/HYSTEROSCOPY WITH MYOSURE    . ENDOMETRIAL BIOPSY    . left malleolar fracture     . uterine biopsy    . VARICOSE VEIN SURGERY      Medical History: Past Medical History:  Diagnosis Date  . Deep venous thrombosis ([place])   . Hyperlipidemia   . Hypertension   . Pulmonary embolism ([place])   . Thyroid disease     Family History: Family History  Problem Relation Age of Onset  . Diabetes Father   . Lung cancer Brother   . Alcohol abuse Maternal Grandmother   . Liver cancer Maternal Grandmother   . Diabetes Paternal Grandfather     Social History   Socioeconomic History  . Marital status: Widowed    Spouse name: Not on file  . Number of children: Not on file  . Years of education: Not on file  . Highest education level: Not on file  Occupational History  . Not on file  Social Needs  . Financial resource strain: Not on file  . Food insecurity:    Worry: Not on file    Inability: Not on file  . Transportation needs:    Medical: Not on file    Non-medical: Not on file  Tobacco Use  . Smoking status: Never Smoker  . Smokeless tobacco: Current User  Substance and Sexual Activity  . Alcohol use: No    Frequency: Never  . Drug use: No  . Sexual activity: Not on file  Lifestyle  . Physical activity:    Days per week: Not on file    Minutes per session: Not on file  . Stress: Not on file  Relationships  . Social connections:    Talks on phone: Not on file    Gets together: Not on file    Attends religious service: Not on file    Active member of club or organization: Not on file    Attends meetings of clubs or organizations: Not on file    Relationship status: Not on file  . Intimate partner violence:    Fear of current or ex partner: Not on file    Emotionally abused: Not on file    Physically abused: Not on file    Forced sexual activity: Not on file  Other Topics Concern  . Not on file  Social History Narrative  . Not on file      Review of Systems  HENT: Positive for congestion, postnasal drip, rhinorrhea, sore throat and voice  change.   Respiratory: Positive for cough and shortness of breath.   Cardiovascular: Positive for chest pain, claudication and leg swelling.  Gastrointestinal: Negative for abdominal pain and vomiting.  Musculoskeletal: Negative for neck pain.  Skin: Negative for rash.   Today's Vitals   03/21/18 1505  BP: (!) 152/91  Pulse: 68  Resp: 16  SpO2: 94%  Weight: 206 lb 6.4 oz (93.6 kg)  Height: 5' 6.5" (1.689 m)    Physical Exam  Constitutional: She is oriented to person, place, and time. She appears well-developed and well-nourished. No  distress.  HENT:  Head: Normocephalic and atraumatic.  Right Ear: Tympanic membrane is bulging.  Left Ear: Tympanic membrane is bulging.  Nose: Rhinorrhea present. Right sinus exhibits frontal sinus tenderness. Left sinus exhibits frontal sinus tenderness.  Mouth/Throat: Posterior oropharyngeal erythema present. No oropharyngeal exudate.  Eyes: Pupils are equal, round, and reactive to light. Conjunctivae and EOM are normal.  Neck: Normal range of motion. Neck supple. No JVD present. No tracheal deviation present. No thyromegaly present.  Cardiovascular: Normal rate. An irregular rhythm present. Exam reveals no gallop and no friction rub.  Murmur heard.  Systolic murmur is present with a grade of 2/6. There is 2+edema, bilateral lower extremities which are both tender to palpate.   Pulmonary/Chest: Effort normal and breath sounds normal. No respiratory distress. She has no wheezes. She has no rales. She exhibits no tenderness.  Abdominal: Soft. Bowel sounds are normal. There is no tenderness.  Musculoskeletal:  Right lower leg is currently in immobilization boot. There is some swelling present in both lower legs.   Lymphadenopathy:    She has cervical adenopathy.  Neurological: She is alert and oriented to person, place, and time. No cranial nerve deficit.  Skin: Skin is warm and dry. She is not diaphoretic.  Psychiatric: Her speech is normal and  behavior is normal. Judgment and thought content normal. Her mood appears anxious. Cognition and memory are normal.  Nursing note and vitals reviewed.   Assessment/Plan: 1. Acute non-recurrent maxillary sinusitis Start z-pack. Take as directed for 5 days. OTC medications as needed to help relieve symptoms.  - azithromycin (ZITHROMAX) 250 MG tablet; z-pack - take as directed for 5 days  Dispense: 6 tablet; Refill: 0  2. Acquired hypothyroidism Check thyroid panel and adjust levothyroxine as indicated.  - TSH + free T4  3. Shortness of breath Reviewed results of VQ scan and chest x-ray. Both show no evidence for PE. Continue medications as prescribed. Will monitor closely.   4. Edema, unspecified type Continue all medications as prescribed. Continue regular visits with vein and vascular as scheduled.   5. Essential hypertension Stable. Continue bp medication as prescribed.   General Counseling: Analissa verbalizes understanding of the findings of todays visit and agrees with plan of treatment. I have discussed any further diagnostic evaluation that may be needed or ordered today. We also reviewed her medications today. she has been encouraged to call the office with any questions or concerns that should arise related to todays visit.  This patient was seen by Leretha Pol, FNP- C in Collaboration with Dr Lavera Guise as a part of collaborative care agreement    Orders Placed This Encounter  Procedures  . TSH + free T4    Meds ordered this encounter  Medications  . azithromycin (ZITHROMAX) 250 MG tablet    Sig: z-pack - take as directed for 5 days    Dispense:  6 tablet    Refill:  0    Order Specific Question:   Supervising Provider    Answer:   Lavera Guise [[id number]]    Time spent: 89 Minutes     Dr Lavera Guise Internal medicine

## 2018-03-23 DIAGNOSIS — J45909 Unspecified asthma, uncomplicated: Secondary | ICD-10-CM | POA: Diagnosis not present

## 2018-03-23 DIAGNOSIS — E039 Hypothyroidism, unspecified: Secondary | ICD-10-CM | POA: Diagnosis not present

## 2018-03-23 DIAGNOSIS — I739 Peripheral vascular disease, unspecified: Secondary | ICD-10-CM | POA: Diagnosis not present

## 2018-03-23 DIAGNOSIS — I890 Lymphedema, not elsewhere classified: Secondary | ICD-10-CM | POA: Diagnosis not present

## 2018-03-23 DIAGNOSIS — I10 Essential (primary) hypertension: Secondary | ICD-10-CM | POA: Diagnosis not present

## 2018-03-23 DIAGNOSIS — S82851D Displaced trimalleolar fracture of right lower leg, subsequent encounter for closed fracture with routine healing: Secondary | ICD-10-CM | POA: Diagnosis not present

## 2018-03-23 DIAGNOSIS — G4730 Sleep apnea, unspecified: Secondary | ICD-10-CM | POA: Diagnosis not present

## 2018-03-23 DIAGNOSIS — I872 Venous insufficiency (chronic) (peripheral): Secondary | ICD-10-CM | POA: Diagnosis not present

## 2018-03-23 DIAGNOSIS — M1990 Unspecified osteoarthritis, unspecified site: Secondary | ICD-10-CM | POA: Diagnosis not present

## 2018-03-27 DIAGNOSIS — S82851D Displaced trimalleolar fracture of right lower leg, subsequent encounter for closed fracture with routine healing: Secondary | ICD-10-CM | POA: Diagnosis not present

## 2018-03-27 DIAGNOSIS — M1990 Unspecified osteoarthritis, unspecified site: Secondary | ICD-10-CM | POA: Diagnosis not present

## 2018-03-27 DIAGNOSIS — I872 Venous insufficiency (chronic) (peripheral): Secondary | ICD-10-CM | POA: Diagnosis not present

## 2018-03-27 DIAGNOSIS — I890 Lymphedema, not elsewhere classified: Secondary | ICD-10-CM | POA: Diagnosis not present

## 2018-03-27 DIAGNOSIS — E039 Hypothyroidism, unspecified: Secondary | ICD-10-CM | POA: Diagnosis not present

## 2018-03-27 DIAGNOSIS — I739 Peripheral vascular disease, unspecified: Secondary | ICD-10-CM | POA: Diagnosis not present

## 2018-03-27 DIAGNOSIS — G4730 Sleep apnea, unspecified: Secondary | ICD-10-CM | POA: Diagnosis not present

## 2018-03-27 DIAGNOSIS — I10 Essential (primary) hypertension: Secondary | ICD-10-CM | POA: Diagnosis not present

## 2018-03-27 DIAGNOSIS — J45909 Unspecified asthma, uncomplicated: Secondary | ICD-10-CM | POA: Diagnosis not present

## 2018-03-28 DIAGNOSIS — J45909 Unspecified asthma, uncomplicated: Secondary | ICD-10-CM | POA: Diagnosis not present

## 2018-03-28 DIAGNOSIS — M1990 Unspecified osteoarthritis, unspecified site: Secondary | ICD-10-CM | POA: Diagnosis not present

## 2018-03-28 DIAGNOSIS — I739 Peripheral vascular disease, unspecified: Secondary | ICD-10-CM | POA: Diagnosis not present

## 2018-03-28 DIAGNOSIS — I872 Venous insufficiency (chronic) (peripheral): Secondary | ICD-10-CM | POA: Diagnosis not present

## 2018-03-28 DIAGNOSIS — S82851D Displaced trimalleolar fracture of right lower leg, subsequent encounter for closed fracture with routine healing: Secondary | ICD-10-CM | POA: Diagnosis not present

## 2018-03-28 DIAGNOSIS — G4730 Sleep apnea, unspecified: Secondary | ICD-10-CM | POA: Diagnosis not present

## 2018-03-28 DIAGNOSIS — I890 Lymphedema, not elsewhere classified: Secondary | ICD-10-CM | POA: Diagnosis not present

## 2018-03-28 DIAGNOSIS — E039 Hypothyroidism, unspecified: Secondary | ICD-10-CM | POA: Diagnosis not present

## 2018-03-28 DIAGNOSIS — I10 Essential (primary) hypertension: Secondary | ICD-10-CM | POA: Diagnosis not present

## 2018-04-04 ENCOUNTER — Telehealth: Payer: Self-pay

## 2018-04-04 NOTE — Telephone Encounter (Signed)
Patient has been advised that her patient assistance application has been denied; product covered by insurance(eliquis)titania

## 2018-04-10 DIAGNOSIS — J0100 Acute maxillary sinusitis, unspecified: Secondary | ICD-10-CM | POA: Insufficient documentation

## 2018-04-10 DIAGNOSIS — E039 Hypothyroidism, unspecified: Secondary | ICD-10-CM | POA: Insufficient documentation

## 2018-04-12 DIAGNOSIS — E039 Hypothyroidism, unspecified: Secondary | ICD-10-CM | POA: Diagnosis not present

## 2018-04-13 LAB — TSH+FREE T4
Free T4: 1.62 ng/dL (ref 0.82–1.77)
TSH: 1.58 u[IU]/mL (ref 0.450–4.500)

## 2018-04-18 ENCOUNTER — Encounter: Payer: Self-pay | Admitting: Nurse Practitioner

## 2018-04-18 ENCOUNTER — Ambulatory Visit (INDEPENDENT_AMBULATORY_CARE_PROVIDER_SITE_OTHER): Payer: Medicare HMO | Admitting: Nurse Practitioner

## 2018-04-18 VITALS — BP 145/89 | HR 77 | Resp 16 | Ht 66.5 in | Wt 206.2 lb

## 2018-04-18 DIAGNOSIS — S82891P Other fracture of right lower leg, subsequent encounter for closed fracture with malunion: Secondary | ICD-10-CM | POA: Diagnosis not present

## 2018-04-18 DIAGNOSIS — I10 Essential (primary) hypertension: Secondary | ICD-10-CM | POA: Diagnosis not present

## 2018-04-18 DIAGNOSIS — J309 Allergic rhinitis, unspecified: Secondary | ICD-10-CM | POA: Diagnosis not present

## 2018-04-18 DIAGNOSIS — Z86718 Personal history of other venous thrombosis and embolism: Secondary | ICD-10-CM | POA: Diagnosis not present

## 2018-04-18 MED ORDER — ELIQUIS 5 MG PO TABS
5.0000 mg | ORAL_TABLET | Freq: Two times a day (BID) | ORAL | 3 refills | Status: DC
Start: 2018-04-18 — End: 2018-08-27

## 2018-04-18 NOTE — Progress Notes (Signed)
[facility] [place], [place] [address]  Internal MEDICINE  Office Visit Note  Patient Name: Nancy Moyer  [id number]  [id number]  Date of Service: 05/12/2018   Pt is here for routine follow up.   Chief Complaint  Patient presents with  . Sinusitis    1 month follow up  . lab results    thyroid panel     Sinusitis  This is a recurrent problem. The current episode started in the past 7 days. The problem has been waxing and waning since onset. There has been no fever. The pain is mild. Associated symptoms include congestion, coughing, headaches, shortness of breath and sinus pressure. Pertinent negatives include no chills, neck pain, sneezing or sore throat. Past treatments include antibiotics. The treatment provided mild relief.       Current Medication: Outpatient Encounter Medications as of 04/18/2018  Medication Sig  . albuterol (PROVENTIL HFA) 108 (90 Base) MCG/ACT inhaler Inhale into the lungs.  Marland Kitchen alendronate (FOSAMAX) 70 MG tablet Take 70 mg by mouth once a week. Take with a full glass of water on an empty stomach.  . Calcium Carbonate-Vitamin D 600-400 MG-UNIT tablet Take by mouth.  . carvedilol (COREG) 6.25 MG tablet   . DULoxetine (CYMBALTA) 30 MG capsule Take 30 mg by mouth daily.  Marland Kitchen ELIQUIS 5 MG TABS tablet Take 1 tablet (5 mg total) by mouth 2 (two) times daily.  . Fluticasone-Salmeterol (ADVAIR) 100-50 MCG/DOSE AEPB Inhale 1 puff into the lungs 2 (two) times daily.  . furosemide (LASIX) 20 MG tablet Take 2 tablets (40 mg total) by mouth daily.  Marland Kitchen levothyroxine (SYNTHROID, LEVOTHROID) 100 MCG tablet Take by mouth.  Marland Kitchen lisinopril (PRINIVIL,ZESTRIL) 10 MG tablet   . lovastatin (MEVACOR) 10 MG tablet TAKE 1 TABLET AT BEDTIME FOR CHOLESTEROL  . meloxicam (MOBIC) 15 MG tablet TAKE 1 TABLET EVERY DAY  . mometasone (NASONEX) 50 MCG/ACT nasal spray Place into the nose.  . montelukast (SINGULAIR) 10 MG tablet Take 1 tablet (10 mg total) by mouth  daily.  . Multiple Vitamin (MULTI-VITAMINS) TABS Take by mouth.  Marland Kitchen omeprazole (PRILOSEC) 20 MG capsule Take 20 mg by mouth.  . potassium chloride (K-DUR,KLOR-CON) 10 MEQ tablet Take by mouth.  . traMADol (ULTRAM) 50 MG tablet Take 50 mg by mouth.  . triamcinolone (KENALOG) 0.025 % cream   . [DISCONTINUED] azithromycin (ZITHROMAX) 250 MG tablet z-pack - take as directed for 5 days  . [DISCONTINUED] ELIQUIS 5 MG TABS tablet Take 1 tablet (5 mg total) by mouth 2 (two) times daily. (Patient not taking: Reported on 03/21/2018)  . [DISCONTINUED] levofloxacin (LEVAQUIN) 500 MG tablet TAKE 1 TABLET EVERY DAY  FOR  7  DAYS  . [DISCONTINUED] methylPREDNISolone (MEDROL DOSEPAK) 4 MG TBPK tablet TAKE AS PRESCRIBED FOR 6 DAYS  . [DISCONTINUED] nitrofurantoin, macrocrystal-monohydrate, (MACROBID) 100 MG capsule Take 1 capsule (100 mg total) by mouth 2 (two) times daily.  . [DISCONTINUED] phenazopyridine (PYRIDIUM) 200 MG tablet Take 1 tablet (200 mg total) by mouth 3 (three) times daily as needed for pain.  . [DISCONTINUED] potassium chloride (K-DUR,KLOR-CON) 10 MEQ tablet Take 1 tab po M ,W and friday   No facility-administered encounter medications on file as of 04/18/2018.     Surgical History: Past Surgical History:  Procedure Laterality Date  . DILATATION & CURETTAGE/HYSTEROSCOPY WITH MYOSURE    . ENDOMETRIAL BIOPSY    . left malleolar fracture    . uterine biopsy    . VARICOSE VEIN  SURGERY      Medical History: Past Medical History:  Diagnosis Date  . Deep venous thrombosis ([place])   . Hyperlipidemia   . Hypertension   . Pulmonary embolism ([place])   . Thyroid disease     Family History: Family History  Problem Relation Age of Onset  . Diabetes Father   . Lung cancer Brother   . Alcohol abuse Maternal Grandmother   . Liver cancer Maternal Grandmother   . Diabetes Paternal Grandfather     Social History   Socioeconomic History  . Marital status: Widowed    Spouse name: Not on file   . Number of children: Not on file  . Years of education: Not on file  . Highest education level: Not on file  Occupational History  . Not on file  Social Needs  . Financial resource strain: Not on file  . Food insecurity:    Worry: Not on file    Inability: Not on file  . Transportation needs:    Medical: Not on file    Non-medical: Not on file  Tobacco Use  . Smoking status: Never Smoker  . Smokeless tobacco: Current User  Substance and Sexual Activity  . Alcohol use: No    Frequency: Never  . Drug use: No  . Sexual activity: Not on file  Lifestyle  . Physical activity:    Days per week: Not on file    Minutes per session: Not on file  . Stress: Not on file  Relationships  . Social connections:    Talks on phone: Not on file    Gets together: Not on file    Attends religious service: Not on file    Active member of club or organization: Not on file    Attends meetings of clubs or organizations: Not on file    Relationship status: Not on file  . Intimate partner violence:    Fear of current or ex partner: Not on file    Emotionally abused: Not on file    Physically abused: Not on file    Forced sexual activity: Not on file  Other Topics Concern  . Not on file  Social History Narrative  . Not on file      Review of Systems  Constitutional: Positive for activity change and fatigue. Negative for chills and unexpected weight change.  HENT: Positive for congestion, postnasal drip, rhinorrhea and sinus pressure. Negative for sneezing and sore throat.   Eyes: Negative.  Negative for redness.  Respiratory: Positive for cough and shortness of breath. Negative for chest tightness.   Cardiovascular: Positive for chest pain and leg swelling. Negative for palpitations.  Gastrointestinal: Negative for abdominal pain, constipation, diarrhea, nausea and vomiting.  Endocrine:       Well managed hypothyroid.  Genitourinary: Negative for dysuria and frequency.   Musculoskeletal: Positive for arthralgias and myalgias. Negative for back pain, joint swelling and neck pain.  Skin: Negative for rash.  Allergic/Immunologic: Positive for environmental allergies.  Neurological: Positive for dizziness, weakness and headaches. Negative for tremors and numbness.  Hematological: Negative for adenopathy. Does not bruise/bleed easily.  Psychiatric/Behavioral: Negative for behavioral problems (Depression), sleep disturbance and suicidal ideas. The patient is nervous/anxious.     Today's Vitals   04/18/18 1207  BP: (!) 145/89  Pulse: 77  Resp: 16  SpO2: 95%  Weight: 206 lb 3.2 oz (93.5 kg)  Height: 5' 6.5" (1.689 m)    Physical Exam  Constitutional: She is oriented to person,  place, and time. She appears well-developed and well-nourished. No distress.  HENT:  Head: Normocephalic and atraumatic.  Right Ear: Tympanic membrane is bulging.  Left Ear: Tympanic membrane is bulging.  Nose: Rhinorrhea present. Right sinus exhibits frontal sinus tenderness. Left sinus exhibits frontal sinus tenderness.  Mouth/Throat: Posterior oropharyngeal erythema present. No oropharyngeal exudate.  Eyes: Pupils are equal, round, and reactive to light. Conjunctivae and EOM are normal.  Neck: Normal range of motion. Neck supple. No JVD present. No tracheal deviation present. No thyromegaly present.  Cardiovascular: Normal rate. An irregular rhythm present. Exam reveals no gallop and no friction rub.  Murmur heard.  Systolic murmur is present with a grade of 2/6. There is 2+edema, bilateral lower extremities which are both tender to palpate.   Pulmonary/Chest: Effort normal and breath sounds normal. No respiratory distress. She has no wheezes. She has no rales. She exhibits no tenderness.  Abdominal: Soft. Bowel sounds are normal. There is no tenderness.  Musculoskeletal:  The patient continues  To have bilateral lower extremity edema with tenderness of the extremities when  palpated.   Lymphadenopathy:    She has cervical adenopathy.  Neurological: She is alert and oriented to person, place, and time. No cranial nerve deficit.  Skin: Skin is warm and dry. Capillary refill takes less than 2 seconds. She is not diaphoretic.  Psychiatric: Her speech is normal and behavior is normal. Judgment and thought content normal. Her mood appears anxious. Cognition and memory are normal.  Nursing note and vitals reviewed.  Assessment/Plan: 1. Chronic allergic rhinitis Refer to Dr. Devona Konig for allergy testing and treatment.  - Ambulatory referral to Pulmonology  2. Essential hypertension Stable. Continue bp medication as prescribed.   3. Closed fracture of malleolus of right ankle with malunion Healing. No longer in immobilizing boot. Continue regular visits with podiatry/orthopedics as scheduled.   4. History of DVT (deep vein thrombosis) Reviewed VQ scan results with patient and her family. Negative for evidence of PE. Encouraged use of Eliquis 5mg  twice daily to prevent additional clots.  - ELIQUIS 5 MG TABS tablet; Take 1 tablet (5 mg total) by mouth 2 (two) times daily.  Dispense: 60 tablet; Refill: 3  General Counseling: Erial verbalizes understanding of the findings of todays visit and agrees with plan of treatment. I have discussed any further diagnostic evaluation that may be needed or ordered today. We also reviewed her medications today. she has been encouraged to call the office with any questions or concerns that should arise related to todays visit.    Counseling:  This patient was seen by Leretha Pol, FNP- C in Collaboration with Dr Lavera Guise as a part of collaborative care agreement  Orders Placed This Encounter  Procedures  . Ambulatory referral to Pulmonology    Meds ordered this encounter  Medications  . ELIQUIS 5 MG TABS tablet    Sig: Take 1 tablet (5 mg total) by mouth 2 (two) times daily.    Dispense:  60 tablet    Refill:  3     Order Specific Question:   Supervising Provider    Answer:   Lavera Guise [[id number]]    Time spent: 4 Minutes     Dr Lavera Guise Internal medicine

## 2018-04-24 DIAGNOSIS — S82855D Nondisplaced trimalleolar fracture of left lower leg, subsequent encounter for closed fracture with routine healing: Secondary | ICD-10-CM | POA: Diagnosis not present

## 2018-04-24 DIAGNOSIS — J449 Chronic obstructive pulmonary disease, unspecified: Secondary | ICD-10-CM | POA: Diagnosis not present

## 2018-05-07 DIAGNOSIS — E039 Hypothyroidism, unspecified: Secondary | ICD-10-CM | POA: Diagnosis not present

## 2018-05-07 DIAGNOSIS — M1990 Unspecified osteoarthritis, unspecified site: Secondary | ICD-10-CM | POA: Diagnosis not present

## 2018-05-07 DIAGNOSIS — E785 Hyperlipidemia, unspecified: Secondary | ICD-10-CM | POA: Diagnosis not present

## 2018-05-07 DIAGNOSIS — I890 Lymphedema, not elsewhere classified: Secondary | ICD-10-CM | POA: Diagnosis not present

## 2018-05-07 DIAGNOSIS — R0989 Other specified symptoms and signs involving the circulatory and respiratory systems: Secondary | ICD-10-CM | POA: Diagnosis not present

## 2018-05-07 DIAGNOSIS — M7989 Other specified soft tissue disorders: Secondary | ICD-10-CM | POA: Diagnosis not present

## 2018-05-07 DIAGNOSIS — Z86718 Personal history of other venous thrombosis and embolism: Secondary | ICD-10-CM | POA: Diagnosis not present

## 2018-05-07 DIAGNOSIS — I10 Essential (primary) hypertension: Secondary | ICD-10-CM | POA: Diagnosis not present

## 2018-05-12 DIAGNOSIS — J309 Allergic rhinitis, unspecified: Secondary | ICD-10-CM | POA: Insufficient documentation

## 2018-06-13 ENCOUNTER — Ambulatory Visit: Payer: Self-pay | Admitting: Internal Medicine

## 2018-06-26 ENCOUNTER — Other Ambulatory Visit: Payer: Self-pay | Admitting: Internal Medicine

## 2018-07-18 ENCOUNTER — Other Ambulatory Visit: Payer: Self-pay

## 2018-07-18 MED ORDER — MONTELUKAST SODIUM 10 MG PO TABS: 10.0000 mg | ORAL_TABLET | Freq: Every day | ORAL | 1 refills | Status: DC

## 2018-07-19 ENCOUNTER — Other Ambulatory Visit: Payer: Self-pay | Admitting: Nurse Practitioner

## 2018-07-19 MED ORDER — OMEPRAZOLE 20 MG PO CPDR: 20.0000 mg | DELAYED_RELEASE_CAPSULE | Freq: Every day | ORAL | 2 refills | Status: DC

## 2018-07-26 DIAGNOSIS — G4733 Obstructive sleep apnea (adult) (pediatric): Secondary | ICD-10-CM | POA: Diagnosis not present

## 2018-08-06 ENCOUNTER — Other Ambulatory Visit: Payer: Self-pay

## 2018-08-06 MED ORDER — LEVOTHYROXINE SODIUM 100 MCG PO TABS: 100.0000 ug | ORAL_TABLET | Freq: Every day | ORAL | 0 refills | Status: DC

## 2018-08-07 ENCOUNTER — Other Ambulatory Visit: Payer: Self-pay

## 2018-08-07 MED ORDER — LEVOTHYROXINE SODIUM 100 MCG PO TABS: 100.0000 ug | ORAL_TABLET | Freq: Every day | ORAL | 1 refills | Status: DC

## 2018-08-09 ENCOUNTER — Ambulatory Visit (INDEPENDENT_AMBULATORY_CARE_PROVIDER_SITE_OTHER): Payer: Medicare HMO | Admitting: Nurse Practitioner

## 2018-08-09 ENCOUNTER — Encounter: Payer: Self-pay | Admitting: Nurse Practitioner

## 2018-08-09 VITALS — BP 152/80 | HR 66 | Temp 96.6°F | Resp 16 | Ht 66.5 in | Wt 211.8 lb

## 2018-08-09 DIAGNOSIS — I10 Essential (primary) hypertension: Secondary | ICD-10-CM

## 2018-08-09 DIAGNOSIS — J4520 Mild intermittent asthma, uncomplicated: Secondary | ICD-10-CM | POA: Diagnosis not present

## 2018-08-09 DIAGNOSIS — E039 Hypothyroidism, unspecified: Secondary | ICD-10-CM | POA: Diagnosis not present

## 2018-08-09 DIAGNOSIS — J069 Acute upper respiratory infection, unspecified: Secondary | ICD-10-CM | POA: Diagnosis not present

## 2018-08-09 DIAGNOSIS — IMO0001 Reserved for inherently not codable concepts without codable children: Secondary | ICD-10-CM | POA: Insufficient documentation

## 2018-08-09 MED ORDER — PREDNISONE 10 MG (21) PO TBPK: ORAL_TABLET | ORAL | 0 refills | Status: DC

## 2018-08-09 MED ORDER — LEVOTHYROXINE SODIUM 100 MCG PO TABS
100.0000 ug | ORAL_TABLET | Freq: Every day | ORAL | 1 refills | Status: DC
Start: 2018-08-09 — End: 2018-08-30

## 2018-08-09 MED ORDER — LEVOFLOXACIN 500 MG PO TABS: 500.0000 mg | ORAL_TABLET | Freq: Every day | ORAL | 0 refills | Status: DC

## 2018-08-09 NOTE — Progress Notes (Signed)
[facility] [place], [place] [address]  Internal MEDICINE  Office Visit Note  Patient Name: Nancy Moyer  [id number]  [id number]  Date of Service: 08/09/2018  Pt is here for a sick visit.  Chief Complaint  Patient presents with  . URI    going on for about a week.     URI   This is a new problem. The current episode started 1 to 4 weeks ago. The problem has been unchanged. There has been no fever. Associated symptoms include congestion, coughing, ear pain, headaches, a plugged ear sensation, rhinorrhea, sinus pain, a sore throat, swollen glands and wheezing. Pertinent negatives include no chest pain, nausea, rash or vomiting. She has tried acetaminophen for the symptoms. The treatment provided no relief.        Current Medication:  Outpatient Encounter Medications as of 08/09/2018  Medication Sig  . albuterol (PROVENTIL HFA) 108 (90 Base) MCG/ACT inhaler Inhale into the lungs.  Marland Kitchen alendronate (FOSAMAX) 70 MG tablet TAKE 1 TABLET EVERY WEEK AS DIRECTED  (STOP  BONIVA)  . Calcium Carbonate-Vitamin D 600-400 MG-UNIT tablet Take by mouth.  . carvedilol (COREG) 6.25 MG tablet   . DULoxetine (CYMBALTA) 30 MG capsule Take 30 mg by mouth daily.  Marland Kitchen ELIQUIS 5 MG TABS tablet Take 1 tablet (5 mg total) by mouth 2 (two) times daily.  . Fluticasone-Salmeterol (ADVAIR) 100-50 MCG/DOSE AEPB Inhale 1 puff into the lungs 2 (two) times daily.  . furosemide (LASIX) 20 MG tablet Take 2 tablets (40 mg total) by mouth daily.  Marland Kitchen levothyroxine (SYNTHROID, LEVOTHROID) 100 MCG tablet Take 1 tablet (100 mcg total) by mouth daily before breakfast.  . lisinopril (PRINIVIL,ZESTRIL) 10 MG tablet   . lovastatin (MEVACOR) 10 MG tablet TAKE 1 TABLET AT BEDTIME FOR CHOLESTEROL  . meloxicam (MOBIC) 15 MG tablet TAKE 1 TABLET EVERY DAY  . mometasone (NASONEX) 50 MCG/ACT nasal spray Place into the nose.  . montelukast (SINGULAIR) 10 MG tablet Take 1 tablet (10 mg total) by mouth daily.   . Multiple Vitamin (MULTI-VITAMINS) TABS Take by mouth.  Marland Kitchen omeprazole (PRILOSEC) 20 MG capsule Take 1 capsule (20 mg total) by mouth daily.  . potassium chloride (K-DUR,KLOR-CON) 10 MEQ tablet Take by mouth.  . traMADol (ULTRAM) 50 MG tablet Take 50 mg by mouth.  . triamcinolone (KENALOG) 0.025 % cream   . [DISCONTINUED] levothyroxine (SYNTHROID, LEVOTHROID) 100 MCG tablet Take 1 tablet (100 mcg total) by mouth daily before breakfast.  . levofloxacin (LEVAQUIN) 500 MG tablet Take 1 tablet (500 mg total) by mouth daily.  . predniSONE (STERAPRED UNI-PAK 21 TAB) 10 MG (21) TBPK tablet 6 day taper - take by mouth as directed for 6 days   No facility-administered encounter medications on file as of 08/09/2018.       Medical History: Past Medical History:  Diagnosis Date  . Deep venous thrombosis ([place])   . Hyperlipidemia   . Hypertension   . Pulmonary embolism ([place])   . Thyroid disease    Today's Vitals   08/09/18 1404  BP: (!) 152/80  Pulse: 66  Resp: 16  Temp: (!) 96.6 F (35.9 C)  SpO2: 95%  Weight: 211 lb 12.8 oz (96.1 kg)  Height: 5' 6.5" (1.689 m)      Review of Systems  Constitutional: Positive for chills and fatigue. Negative for fever.  HENT: Positive for congestion, ear pain, postnasal drip, rhinorrhea, sinus pain, sore throat and voice change.   Eyes: Negative.  Respiratory: Positive for cough and wheezing.   Cardiovascular: Positive for leg swelling. Negative for chest pain and palpitations.  Gastrointestinal: Negative for nausea and vomiting.  Endocrine: Negative for cold intolerance, heat intolerance, polydipsia, polyphagia and polyuria.  Genitourinary: Negative.   Musculoskeletal: Positive for arthralgias and myalgias.  Skin: Negative for rash.  Allergic/Immunologic: Positive for environmental allergies.  Neurological: Positive for headaches.  Hematological: Positive for adenopathy.  Psychiatric/Behavioral: The patient is nervous/anxious.     Physical  Exam  Constitutional: She is oriented to person, place, and time. She appears well-developed and well-nourished. No distress.  HENT:  Head: Normocephalic and atraumatic.  Right Ear: Tympanic membrane is bulging.  Left Ear: Tympanic membrane is bulging.  Nose: Rhinorrhea present. Right sinus exhibits maxillary sinus tenderness and frontal sinus tenderness. Left sinus exhibits maxillary sinus tenderness and frontal sinus tenderness.  Mouth/Throat: Oropharynx is clear and moist. No oropharyngeal exudate.  Eyes: Pupils are equal, round, and reactive to light. EOM are normal.  Neck: Normal range of motion. Neck supple. No JVD present. No tracheal deviation present. No thyromegaly present.  Cardiovascular: Normal rate, regular rhythm and normal heart sounds. Exam reveals no gallop and no friction rub.  No murmur heard. Pulmonary/Chest: Effort normal. No respiratory distress. She has wheezes. She has no rales. She exhibits no tenderness.  Abdominal: Soft. Bowel sounds are normal. There is no tenderness.  Musculoskeletal: Normal range of motion.  Lymphadenopathy:    She has cervical adenopathy.  Neurological: She is alert and oriented to person, place, and time. No cranial nerve deficit.  Skin: Skin is warm and dry. Capillary refill takes less than 2 seconds. She is not diaphoretic.  Psychiatric: She has a normal mood and affect. Her behavior is normal. Judgment and thought content normal.  Nursing note and vitals reviewed.  Assessment/Plan:   1. Moderate intermittent asthma without complication Add prednisone dose pack. Take as directed for 6 days. Use inhaler and/or nrbulizer treatments as needed and as prescribed for wheezing and shortness of breath.  - predniSONE (STERAPRED UNI-PAK 21 TAB) 10 MG (21) TBPK tablet; 6 day taper - take by mouth as directed for 6 days  Dispense: 21 tablet; Refill: 0  2. Acute upper respiratory infection levaquin 500mg  daily for 10 days. Rest and increase fluids.  Take tylenol as needed and as indicated for headache.  - levofloxacin (LEVAQUIN) 500 MG tablet; Take 1 tablet (500 mg total) by mouth daily.  Dispense: 10 tablet; Refill: 0  3. Acquired hypothyroidism - levothyroxine (SYNTHROID, LEVOTHROID) 100 MCG tablet; Take 1 tablet (100 mcg total) by mouth daily before breakfast.  Dispense: 90 tablet; Refill: 1  4. Essential hypertension Elevated today, but generally stable. Continue bp medication as prescribed.   General Counseling: Eller verbalizes understanding of the findings of todays visit and agrees with plan of treatment. I have discussed any further diagnostic evaluation that may be needed or ordered today. We also reviewed her medications today. she has been encouraged to call the office with any questions or concerns that should arise related to todays visit.    Counseling:  Rest and increase fluids. Continue using OTC medication to control symptoms.   This patient was seen by [place] with Dr Lavera Guise as a part of collaborative care agreement    Meds ordered this encounter  Medications  . levofloxacin (LEVAQUIN) 500 MG tablet    Sig: Take 1 tablet (500 mg total) by mouth daily.    Dispense:  10 tablet  Refill:  0    Order Specific Question:   Supervising Provider    Answer:   Lavera Guise [[id number]]  . predniSONE (STERAPRED UNI-PAK 21 TAB) 10 MG (21) TBPK tablet    Sig: 6 day taper - take by mouth as directed for 6 days    Dispense:  21 tablet    Refill:  0    Order Specific Question:   Supervising Provider    Answer:   Lavera Guise [place]  . levothyroxine (SYNTHROID, LEVOTHROID) 100 MCG tablet    Sig: Take 1 tablet (100 mcg total) by mouth daily before breakfast.    Dispense:  90 tablet    Refill:  1    Order Specific Question:   Supervising Provider    Answer:   Lavera Guise [[id number]]    Time spent: 15 Minutes

## 2018-08-27 ENCOUNTER — Ambulatory Visit (INDEPENDENT_AMBULATORY_CARE_PROVIDER_SITE_OTHER): Payer: Medicare HMO | Admitting: Adult Health

## 2018-08-27 ENCOUNTER — Encounter: Payer: Self-pay | Admitting: Adult Health

## 2018-08-27 VITALS — BP 152/92 | HR 73 | Resp 16 | Ht 66.5 in | Wt 214.4 lb

## 2018-08-27 DIAGNOSIS — R300 Dysuria: Secondary | ICD-10-CM

## 2018-08-27 DIAGNOSIS — R51 Headache: Secondary | ICD-10-CM

## 2018-08-27 DIAGNOSIS — Z0001 Encounter for general adult medical examination with abnormal findings: Secondary | ICD-10-CM

## 2018-08-27 DIAGNOSIS — E785 Hyperlipidemia, unspecified: Secondary | ICD-10-CM

## 2018-08-27 DIAGNOSIS — Z86718 Personal history of other venous thrombosis and embolism: Secondary | ICD-10-CM

## 2018-08-27 DIAGNOSIS — R5383 Other fatigue: Secondary | ICD-10-CM | POA: Diagnosis not present

## 2018-08-27 DIAGNOSIS — E756 Lipid storage disorder, unspecified: Secondary | ICD-10-CM | POA: Diagnosis not present

## 2018-08-27 DIAGNOSIS — I10 Essential (primary) hypertension: Secondary | ICD-10-CM | POA: Diagnosis not present

## 2018-08-27 DIAGNOSIS — E0781 Sick-euthyroid syndrome: Secondary | ICD-10-CM | POA: Diagnosis not present

## 2018-08-27 DIAGNOSIS — E079 Disorder of thyroid, unspecified: Secondary | ICD-10-CM | POA: Diagnosis not present

## 2018-08-27 DIAGNOSIS — E039 Hypothyroidism, unspecified: Secondary | ICD-10-CM | POA: Diagnosis not present

## 2018-08-27 DIAGNOSIS — R5381 Other malaise: Secondary | ICD-10-CM | POA: Diagnosis not present

## 2018-08-27 DIAGNOSIS — I890 Lymphedema, not elsewhere classified: Secondary | ICD-10-CM

## 2018-08-27 DIAGNOSIS — R519 Headache, unspecified: Secondary | ICD-10-CM

## 2018-08-27 MED ORDER — ELIQUIS 5 MG PO TABS: 5.0000 mg | ORAL_TABLET | Freq: Two times a day (BID) | ORAL | 3 refills | Status: DC

## 2018-08-27 MED ORDER — LISINOPRIL 20 MG PO TABS
20.0000 mg | ORAL_TABLET | Freq: Every day | ORAL | 2 refills | Status: DC
Start: 2018-08-27 — End: 2018-11-19

## 2018-08-27 NOTE — Patient Instructions (Signed)

## 2018-08-27 NOTE — Progress Notes (Signed)
[facility] [place], [place] [address]  Internal MEDICINE  Office Visit Note  Patient Name: Nancy Moyer  [id number]  [id number]  Date of Service: 08/31/2018  Chief Complaint  Patient presents with  . Annual Exam    medicare wellness visit , took a fall on Saturday  . Hypertension  . Hyperlipidemia  . Headache    been keeping a headache every day   . Edema    swelling still on the right leg some days are worse then others  . Hypothyroidism     HPI Pt is here for routine health maintenance examination.  She is generally doing well.  She does report a fall while stepping from the grass to the driveway.  She was wearing new flip flops and lost her balance.  Her blood pressure is elevated today, and she has been reporting an intermittent headache.  She describes a nagging little headache. She states that her headache gets when she uses the albuterol MDI. Her increased blood pressure has been common over the last few months. She was recently treated for sinus infection, and feels like it may be lingering.  She reports continued edema to BLE.  She has an extensive vascular history with her BLE, and continues to have issues. Denies tobacco, alcohol, or illicit drug use.      Current Medication: Outpatient Encounter Medications as of 08/27/2018  Medication Sig  . albuterol (PROVENTIL HFA) 108 (90 Base) MCG/ACT inhaler Inhale into the lungs.  Marland Kitchen alendronate (FOSAMAX) 70 MG tablet TAKE 1 TABLET EVERY WEEK AS DIRECTED  (STOP  BONIVA)  . Calcium Carbonate-Vitamin D 600-400 MG-UNIT tablet Take by mouth.  . carvedilol (COREG) 6.25 MG tablet   . ELIQUIS 5 MG TABS tablet Take 1 tablet (5 mg total) by mouth 2 (two) times daily.  . Fluticasone-Salmeterol (ADVAIR) 100-50 MCG/DOSE AEPB Inhale 1 puff into the lungs 2 (two) times daily.  . furosemide (LASIX) 20 MG tablet Take 2 tablets (40 mg total) by mouth daily.  Marland Kitchen lovastatin (MEVACOR) 10 MG tablet TAKE 1 TABLET AT  BEDTIME FOR CHOLESTEROL  . meloxicam (MOBIC) 15 MG tablet TAKE 1 TABLET EVERY DAY  . mometasone (NASONEX) 50 MCG/ACT nasal spray Place into the nose.  . montelukast (SINGULAIR) 10 MG tablet Take 1 tablet (10 mg total) by mouth daily.  . Multiple Vitamin (MULTI-VITAMINS) TABS Take by mouth.  Marland Kitchen omeprazole (PRILOSEC) 20 MG capsule Take 1 capsule (20 mg total) by mouth daily.  . potassium chloride (K-DUR,KLOR-CON) 10 MEQ tablet Take by mouth.  . [DISCONTINUED] ELIQUIS 5 MG TABS tablet Take 1 tablet (5 mg total) by mouth 2 (two) times daily.  . [DISCONTINUED] levothyroxine (SYNTHROID, LEVOTHROID) 100 MCG tablet Take 1 tablet (100 mcg total) by mouth daily before breakfast.  . [DISCONTINUED] lisinopril (PRINIVIL,ZESTRIL) 10 MG tablet   . DULoxetine (CYMBALTA) 30 MG capsule Take 30 mg by mouth daily.  Marland Kitchen lisinopril (PRINIVIL,ZESTRIL) 20 MG tablet Take 1 tablet (20 mg total) by mouth daily.  Marland Kitchen triamcinolone (KENALOG) 0.025 % cream   . [DISCONTINUED] levofloxacin (LEVAQUIN) 500 MG tablet Take 1 tablet (500 mg total) by mouth daily. (Patient not taking: Reported on 08/27/2018)  . [DISCONTINUED] predniSONE (STERAPRED UNI-PAK 21 TAB) 10 MG (21) TBPK tablet 6 day taper - take by mouth as directed for 6 days  . [DISCONTINUED] traMADol (ULTRAM) 50 MG tablet Take 50 mg by mouth.   No facility-administered encounter medications on file as of 08/27/2018.  Surgical History: Past Surgical History:  Procedure Laterality Date  . DILATATION & CURETTAGE/HYSTEROSCOPY WITH MYOSURE    . ENDOMETRIAL BIOPSY    . left malleolar fracture    . uterine biopsy    . VARICOSE VEIN SURGERY      Medical History: Past Medical History:  Diagnosis Date  . Deep venous thrombosis ([place])   . Hyperlipidemia   . Hypertension   . Pulmonary embolism ([place])   . Thyroid disease     Family History: Family History  Problem Relation Age of Onset  . Diabetes Father   . Lung cancer Brother   . Alcohol abuse Maternal Grandmother    . Liver cancer Maternal Grandmother   . Diabetes Paternal Grandfather       Review of Systems  Constitutional: Negative for chills, fatigue and unexpected weight change.  HENT: Positive for sinus pressure and sinus pain. Negative for congestion, rhinorrhea, sneezing and sore throat.   Eyes: Negative for photophobia, pain and redness.  Respiratory: Negative for cough, chest tightness and shortness of breath.   Cardiovascular: Negative for chest pain and palpitations.  Gastrointestinal: Negative for abdominal pain, constipation, diarrhea, nausea and vomiting.  Endocrine: Negative.   Genitourinary: Negative for dysuria and frequency.  Musculoskeletal: Negative for arthralgias, back pain, joint swelling and neck pain.  Skin: Positive for color change. Negative for rash.       pts left foot and toes are purple/red.  She reports this is her normal.  Allergic/Immunologic: Negative.   Neurological: Positive for headaches. Negative for dizziness, tremors and numbness.  Hematological: Negative for adenopathy. Does not bruise/bleed easily.  Psychiatric/Behavioral: Negative for behavioral problems and sleep disturbance. The patient is not nervous/anxious.      Vital Signs: BP (!) 152/92   Pulse 73   Resp 16   Ht 5' 6.5" (1.689 m)   Wt 214 lb 6.4 oz (97.3 kg)   SpO2 95%   BMI 34.09 kg/m    Physical Exam  Constitutional: She is oriented to person, place, and time. She appears well-developed and well-nourished. No distress.  HENT:  Head: Normocephalic and atraumatic.  Mouth/Throat: Oropharynx is clear and moist. No oropharyngeal exudate.  Eyes: Pupils are equal, round, and reactive to light. EOM are normal.  Neck: Normal range of motion. Neck supple. No JVD present. No tracheal deviation present. No thyromegaly present.  Cardiovascular: Normal rate, regular rhythm and normal heart sounds. Exam reveals no gallop and no friction rub.  No murmur heard. Pulmonary/Chest: Effort normal and  breath sounds normal. No respiratory distress. She has no wheezes. She has no rales. She exhibits no tenderness.  Abdominal: Soft. There is no tenderness. There is no guarding.  Musculoskeletal: Normal range of motion.  Lymphadenopathy:    She has no cervical adenopathy.  Neurological: She is alert and oriented to person, place, and time. No cranial nerve deficit.  Skin: Skin is warm and dry. She is not diaphoretic.  Large skin abrasion right elbow from fall.  No signs of infection at this time.   Psychiatric: She has a normal mood and affect. Her behavior is normal. Judgment and thought content normal.  Nursing note and vitals reviewed.    LABS: Recent Results (from the past 2160 hour(s))  UA/M w/rflx Culture, Routine     Status: Abnormal (Preliminary result)   Collection Time: 08/27/18 10:33 AM  Result Value Ref Range   Specific Gravity, UA 1.011 1.005 - 1.030   pH, UA 6.0 5.0 - 7.5   Color,  UA Yellow Yellow   Appearance Ur Clear Clear   Leukocytes, UA Trace (A) Negative   Protein, UA Negative Negative/Trace   Glucose, UA Negative Negative   Ketones, UA Negative Negative   RBC, UA Negative Negative   Bilirubin, UA Negative Negative   Urobilinogen, Ur 0.2 0.2 - 1.0 mg/dL   Nitrite, UA Negative Negative   Microscopic Examination See below:     Comment: Microscopic was indicated and was performed.   Urinalysis Reflex Comment     Comment: This specimen has reflexed to a Urine Culture.  Microscopic Examination     Status: None   Collection Time: 08/27/18 10:33 AM  Result Value Ref Range   WBC, UA 0-5 0 - 5 /hpf   RBC, UA 0-2 0 - 2 /hpf   Epithelial Cells (non renal) 0-10 0 - 10 /hpf   Casts None seen None seen /lpf   Mucus, UA Present Not Estab.   Bacteria, UA Few None seen/Few  Urine Culture, Reflex     Status: Abnormal (Preliminary result)   Collection Time: 08/27/18 10:33 AM  Result Value Ref Range   Urine Culture, Routine Preliminary report (A)    Organism ID, Bacteria  Gram negative rods (A)     Comment: 10,000-25,000 colony forming units per mL   ORGANISM ID, BACTERIA Comment     Comment: Mixed urogenital flora Less than 10,000 colonies/mL   CBC with Differential/Platelet     Status: None   Collection Time: 08/27/18 11:41 AM  Result Value Ref Range   WBC 7.4 3.4 - 10.8 x10E3/uL   RBC 5.02 3.77 - 5.28 x10E6/uL   Hemoglobin 14.7 11.1 - 15.9 g/dL   Hematocrit 45.3 34.0 - 46.6 %   MCV 90 79 - 97 fL   MCH 29.3 26.6 - 33.0 pg   MCHC 32.5 31.5 - 35.7 g/dL   RDW 13.1 12.3 - 15.4 %   Platelets 183 150 - 450 x10E3/uL   Neutrophils 54 Not Estab. %   Lymphs 33 Not Estab. %   Monocytes 9 Not Estab. %   Eos 4 Not Estab. %   Basos 0 Not Estab. %   Neutrophils Absolute 4.0 1.4 - 7.0 x10E3/uL   Lymphocytes Absolute 2.4 0.7 - 3.1 x10E3/uL   Monocytes Absolute 0.6 0.1 - 0.9 x10E3/uL   EOS (ABSOLUTE) 0.3 0.0 - 0.4 x10E3/uL   Basophils Absolute 0.0 0.0 - 0.2 x10E3/uL   Immature Granulocytes 0 Not Estab. %   Immature Grans (Abs) 0.0 0.0 - 0.1 x10E3/uL  Lipid Panel With LDL/HDL Ratio     Status: Abnormal   Collection Time: 08/27/18 11:41 AM  Result Value Ref Range   Cholesterol, Total 196 100 - 199 mg/dL   Triglycerides 113 0 - 149 mg/dL   HDL 68 >39 mg/dL   VLDL Cholesterol Cal 23 5 - 40 mg/dL   LDL Calculated 105 (H) 0 - 99 mg/dL   LDl/HDL Ratio 1.5 0.0 - 3.2 ratio    Comment:                                     LDL/HDL Ratio                                             Men  Women  1/2 Avg.Risk  1.0    1.5                                   Avg.Risk  3.6    3.2                                2X Avg.Risk  6.2    5.0                                3X Avg.Risk  8.0    6.1   TSH     Status: None   Collection Time: 08/27/18 11:41 AM  Result Value Ref Range   TSH 2.590 0.450 - 4.500 uIU/mL  T4, free     Status: None   Collection Time: 08/27/18 11:41 AM  Result Value Ref Range   Free T4 1.52 0.82 - 1.77 ng/dL  Comprehensive  metabolic panel     Status: Abnormal   Collection Time: 08/27/18 11:41 AM  Result Value Ref Range   Glucose 113 (H) 65 - 99 mg/dL   BUN 16 8 - 27 mg/dL   Creatinine, Ser 1.07 (H) 0.57 - 1.00 mg/dL   GFR calc non Af Amer 49 (L) >59 mL/min/1.73   GFR calc Af Amer 57 (L) >59 mL/min/1.73   BUN/Creatinine Ratio 15 12 - 28   Sodium 142 134 - 144 mmol/L   Potassium 4.7 3.5 - 5.2 mmol/L   Chloride 102 96 - 106 mmol/L   CO2 24 20 - 29 mmol/L   Calcium 9.3 8.7 - 10.3 mg/dL   Total Protein 6.9 6.0 - 8.5 g/dL   Albumin 4.2 3.5 - 4.8 g/dL   Globulin, Total 2.7 1.5 - 4.5 g/dL   Albumin/Globulin Ratio 1.6 1.2 - 2.2   Bilirubin Total 0.8 0.0 - 1.2 mg/dL   Alkaline Phosphatase 52 39 - 117 IU/L   AST 18 0 - 40 IU/L   ALT 12 0 - 32 IU/L   Assessment/Plan: 1. Encounter for general adult medical examination with abnormal findings Will review lab when results are available.  - CBC with Differential/Platelet - Lipid Panel With LDL/HDL Ratio - TSH - T4, free - Comprehensive metabolic panel  2. Essential hypertension Elevated today, increase lisinopril to 20mg  daily.   3. Sinus headache Continue Singulair, Nasonex.   4. Acquired hypothyroidism Continue synthroid as directed.  5. Hyperlipidemia, unspecified hyperlipidemia type Will check lipid panel.  6. Lymphedema Remains in BLE, has seen vascular.    7. Dysuria - UA/M w/rflx Culture, Routine  General Counseling: Sujata verbalizes understanding of the findings of todays visit and agrees with plan of treatment. I have discussed any further diagnostic evaluation that may be needed or ordered today. We also reviewed her medications today. she has been encouraged to call the office with any questions or concerns that should arise related to todays visit.   Orders Placed This Encounter  Procedures  . Microscopic Examination  . Urine Culture, Reflex  . UA/M w/rflx Culture, Routine  . CBC with Differential/Platelet  . Lipid Panel With  LDL/HDL Ratio  . TSH  . T4, free  . Comprehensive metabolic panel    Meds ordered this encounter  Medications  . lisinopril (PRINIVIL,ZESTRIL) 20 MG tablet    Sig: Take 1 tablet (20  mg total) by mouth daily.    Dispense:  30 tablet    Refill:  2  . ELIQUIS 5 MG TABS tablet    Sig: Take 1 tablet (5 mg total) by mouth 2 (two) times daily.    Dispense:  60 tablet    Refill:  3    Time spent: 25 Minutes   This patient was seen by Orson Gear AGNP-C in Collaboration with Dr Lavera Guise as a part of collaborative care agreement   Lavera Guise, MD  Internal Medicine

## 2018-08-28 LAB — LIPID PANEL WITH LDL/HDL RATIO
CHOLESTEROL TOTAL: 196 mg/dL (ref 100–199)
HDL: 68 mg/dL (ref >39)
LDL Calculated: 105 mg/dL — ABNORMAL HIGH (ref 0–99)
LDl/HDL Ratio: 1.5 ratio (ref 0.0–3.2)
Triglycerides: 113 mg/dL (ref 0–149)
VLDL CHOLESTEROL CAL: 23 mg/dL (ref 5–40)

## 2018-08-28 LAB — CBC WITH DIFFERENTIAL/PLATELET
BASOS ABS: 0 10*3/uL (ref 0.0–0.2)
Basos: 0 %
EOS (ABSOLUTE): 0.3 10*3/uL (ref 0.0–0.4)
Eos: 4 %
Hematocrit: 45.3 % (ref 34.0–46.6)
Hemoglobin: 14.7 g/dL (ref 11.1–15.9)
IMMATURE GRANS (ABS): 0 10*3/uL (ref 0.0–0.1)
IMMATURE GRANULOCYTES: 0 %
Lymphocytes Absolute: 2.4 10*3/uL (ref 0.7–3.1)
Lymphs: 33 %
MCH: 29.3 pg (ref 26.6–33.0)
MCHC: 32.5 g/dL (ref 31.5–35.7)
MCV: 90 fL (ref 79–97)
MONOCYTES: 9 %
MONOS ABS: 0.6 10*3/uL (ref 0.1–0.9)
Neutrophils Absolute: 4 10*3/uL (ref 1.4–7.0)
Neutrophils: 54 %
PLATELETS: 183 10*3/uL (ref 150–450)
RBC: 5.02 x10E6/uL (ref 3.77–5.28)
RDW: 13.1 % (ref 12.3–15.4)
WBC: 7.4 10*3/uL (ref 3.4–10.8)

## 2018-08-28 LAB — COMPREHENSIVE METABOLIC PANEL
A/G RATIO: 1.6 (ref 1.2–2.2)
ALT: 12 IU/L (ref 0–32)
AST: 18 IU/L (ref 0–40)
Albumin: 4.2 g/dL (ref 3.5–4.8)
Alkaline Phosphatase: 52 IU/L (ref 39–117)
BUN/Creatinine Ratio: 15 (ref 12–28)
BUN: 16 mg/dL (ref 8–27)
Bilirubin Total: 0.8 mg/dL (ref 0.0–1.2)
CALCIUM: 9.3 mg/dL (ref 8.7–10.3)
CO2: 24 mmol/L (ref 20–29)
CREATININE: 1.07 mg/dL — AB (ref 0.57–1.00)
Chloride: 102 mmol/L (ref 96–106)
GFR, EST AFRICAN AMERICAN: 57 mL/min/{1.73_m2} — AB (ref >59)
GFR, EST NON AFRICAN AMERICAN: 49 mL/min/{1.73_m2} — AB (ref >59)
Globulin, Total: 2.7 g/dL (ref 1.5–4.5)
Glucose: 113 mg/dL — ABNORMAL HIGH (ref 65–99)
POTASSIUM: 4.7 mmol/L (ref 3.5–5.2)
Sodium: 142 mmol/L (ref 134–144)
TOTAL PROTEIN: 6.9 g/dL (ref 6.0–8.5)

## 2018-08-28 LAB — TSH: TSH: 2.59 u[IU]/mL (ref 0.450–4.500)

## 2018-08-28 LAB — T4, FREE: FREE T4: 1.52 ng/dL (ref 0.82–1.77)

## 2018-08-30 ENCOUNTER — Other Ambulatory Visit: Payer: Self-pay

## 2018-08-30 DIAGNOSIS — E039 Hypothyroidism, unspecified: Secondary | ICD-10-CM

## 2018-08-30 MED ORDER — LEVOTHYROXINE SODIUM 100 MCG PO TABS: 100.0000 ug | ORAL_TABLET | Freq: Every day | ORAL | 1 refills | Status: DC

## 2018-09-02 ENCOUNTER — Other Ambulatory Visit: Payer: Self-pay | Admitting: Adult Health

## 2018-09-02 ENCOUNTER — Telehealth: Payer: Self-pay

## 2018-09-02 LAB — UA/M W/RFLX CULTURE, ROUTINE
Bilirubin, UA: NEGATIVE
Glucose, UA: NEGATIVE
Ketones, UA: NEGATIVE
Nitrite, UA: NEGATIVE
PH UA: 6 (ref 5.0–7.5)
Protein, UA: NEGATIVE
RBC, UA: NEGATIVE
Specific Gravity, UA: 1.011 (ref 1.005–1.030)
Urobilinogen, Ur: 0.2 mg/dL (ref 0.2–1.0)

## 2018-09-02 LAB — URINE CULTURE, REFLEX

## 2018-09-02 LAB — MICROSCOPIC EXAMINATION: CASTS: NONE SEEN /LPF

## 2018-09-02 MED ORDER — NITROFURANTOIN MONOHYD MACRO 100 MG PO CAPS: 100.0000 mg | ORAL_CAPSULE | Freq: Two times a day (BID) | ORAL | 0 refills | Status: DC

## 2018-09-02 NOTE — Telephone Encounter (Signed)
lmom  To call us back  

## 2018-09-03 ENCOUNTER — Telehealth: Payer: Self-pay

## 2018-09-03 NOTE — Telephone Encounter (Signed)
Pt advised that urine did showed infection and adam send macrobid

## 2018-10-08 ENCOUNTER — Ambulatory Visit: Payer: Self-pay | Admitting: Adult Health

## 2018-10-09 ENCOUNTER — Other Ambulatory Visit: Payer: Self-pay | Admitting: Nurse Practitioner

## 2018-10-09 DIAGNOSIS — Z86718 Personal history of other venous thrombosis and embolism: Secondary | ICD-10-CM

## 2018-10-11 ENCOUNTER — Ambulatory Visit (INDEPENDENT_AMBULATORY_CARE_PROVIDER_SITE_OTHER): Payer: Medicare HMO | Admitting: Nurse Practitioner

## 2018-10-11 ENCOUNTER — Encounter: Payer: Self-pay | Admitting: Nurse Practitioner

## 2018-10-11 VITALS — BP 168/88 | HR 72 | Resp 16 | Ht 66.5 in | Wt 212.8 lb

## 2018-10-11 DIAGNOSIS — N289 Disorder of kidney and ureter, unspecified: Secondary | ICD-10-CM

## 2018-10-11 DIAGNOSIS — I10 Essential (primary) hypertension: Secondary | ICD-10-CM

## 2018-10-11 DIAGNOSIS — E039 Hypothyroidism, unspecified: Secondary | ICD-10-CM

## 2018-10-11 MED ORDER — CARVEDILOL 12.5 MG PO TABS
12.5000 mg | ORAL_TABLET | Freq: Two times a day (BID) | ORAL | 3 refills | Status: DC
Start: 2018-10-11 — End: 2018-10-15

## 2018-10-11 NOTE — Progress Notes (Signed)
[facility] [place], [place] [address]  Internal MEDICINE  Office Visit Note  Patient Name: Nancy Moyer  [id number]  [id number]  Date of Service: 10/12/2018  Chief Complaint  Patient presents with  . Medical Management of Chronic Issues    6wk follow up. pt stated she havent had any energy the last couple of months   . Hypertension  . Labs Only    pt had blood work done     Patient arrives for 6 weeks follow up due to Hypertension. Her current blood pressure is 168/88. Patient reports mild headache 2/10. Patient does not take anything for headache. Her previous issues sinusitis and UTI got resolved, but patient still feel fatigued today. She takes all her medications as prescribed, and wears CPAP at night. Her labs were review and found unremarkable except slight elevation in creatinine. Patient states that she does not drink much water. Patient encouraged to increase her fluid intake and eat small frequent meals through the day. Otherwise, patient denies, chest pain, shortness of breath, vision changes, fever, and body ache. Patient uses her Albuterol inhaler as needed and currently does not increased the frequency use.      Current Medication: Outpatient Encounter Medications as of 10/11/2018  Medication Sig  . albuterol (PROVENTIL HFA) 108 (90 Base) MCG/ACT inhaler Inhale into the lungs.  Marland Kitchen alendronate (FOSAMAX) 70 MG tablet TAKE 1 TABLET EVERY WEEK AS DIRECTED  (STOP  BONIVA)  . Calcium Carbonate-Vitamin D 600-400 MG-UNIT tablet Take by mouth.  . carvedilol (COREG) 12.5 MG tablet Take 1 tablet (12.5 mg total) by mouth 2 (two) times daily with a meal.  . DULoxetine (CYMBALTA) 30 MG capsule Take 30 mg by mouth daily.  Marland Kitchen ELIQUIS 5 MG TABS tablet Take 1 tablet (5 mg total) by mouth 2 (two) times daily.  . Fluticasone-Salmeterol (ADVAIR) 100-50 MCG/DOSE AEPB Inhale 1 puff into the lungs 2 (two) times daily.  . furosemide (LASIX) 20 MG tablet Take 2  tablets (40 mg total) by mouth daily.  Marland Kitchen levothyroxine (SYNTHROID, LEVOTHROID) 100 MCG tablet Take 1 tablet (100 mcg total) by mouth daily before breakfast.  . lisinopril (PRINIVIL,ZESTRIL) 20 MG tablet Take 1 tablet (20 mg total) by mouth daily.  Marland Kitchen lovastatin (MEVACOR) 10 MG tablet TAKE 1 TABLET AT BEDTIME FOR CHOLESTEROL  . meloxicam (MOBIC) 15 MG tablet TAKE 1 TABLET EVERY DAY  . mometasone (NASONEX) 50 MCG/ACT nasal spray Place into the nose.  . montelukast (SINGULAIR) 10 MG tablet Take 1 tablet (10 mg total) by mouth daily.  . Multiple Vitamin (MULTI-VITAMINS) TABS Take by mouth.  . nitrofurantoin, macrocrystal-monohydrate, (MACROBID) 100 MG capsule Take 1 capsule (100 mg total) by mouth 2 (two) times daily.  Marland Kitchen omeprazole (PRILOSEC) 20 MG capsule Take 1 capsule (20 mg total) by mouth daily.  . potassium chloride (K-DUR,KLOR-CON) 10 MEQ tablet Take by mouth.  . triamcinolone (KENALOG) 0.025 % cream   . [DISCONTINUED] carvedilol (COREG) 6.25 MG tablet   . [DISCONTINUED] ELIQUIS 5 MG TABS tablet TAKE 1 TABLET BY MOUTH TWICE A DAY   No facility-administered encounter medications on file as of 10/11/2018.     Surgical History: Past Surgical History:  Procedure Laterality Date  . DILATATION & CURETTAGE/HYSTEROSCOPY WITH MYOSURE    . ENDOMETRIAL BIOPSY    . left malleolar fracture    . uterine biopsy    . VARICOSE VEIN SURGERY      Medical History: Past Medical History:  Diagnosis Date  .  Deep venous thrombosis ([place])   . Hyperlipidemia   . Hypertension   . Pulmonary embolism ([place])   . Thyroid disease     Family History: Family History  Problem Relation Age of Onset  . Diabetes Father   . Lung cancer Brother   . Alcohol abuse Maternal Grandmother   . Liver cancer Maternal Grandmother   . Diabetes Paternal Grandfather     Social History   Socioeconomic History  . Marital status: Widowed    Spouse name: Not on file  . Number of children: Not on file  . Years of  education: Not on file  . Highest education level: Not on file  Occupational History  . Not on file  Social Needs  . Financial resource strain: Not on file  . Food insecurity:    Worry: Not on file    Inability: Not on file  . Transportation needs:    Medical: Not on file    Non-medical: Not on file  Tobacco Use  . Smoking status: Never Smoker  . Smokeless tobacco: Current User  Substance and Sexual Activity  . Alcohol use: No    Frequency: Never  . Drug use: No  . Sexual activity: Not on file  Lifestyle  . Physical activity:    Days per week: Not on file    Minutes per session: Not on file  . Stress: Not on file  Relationships  . Social connections:    Talks on phone: Not on file    Gets together: Not on file    Attends religious service: Not on file    Active member of club or organization: Not on file    Attends meetings of clubs or organizations: Not on file    Relationship status: Not on file  . Intimate partner violence:    Fear of current or ex partner: Not on file    Emotionally abused: Not on file    Physically abused: Not on file    Forced sexual activity: Not on file  Other Topics Concern  . Not on file  Social History Narrative  . Not on file      Review of Systems  Constitutional: Positive for fatigue. Negative for activity change, chills, diaphoresis, fever and unexpected weight change.       Patient feels fatigued and gets tired easily. She noticed decrease in daily activities.  HENT: Negative for congestion, ear discharge, facial swelling, hearing loss, postnasal drip, rhinorrhea, sinus pressure, sinus pain, sore throat, trouble swallowing and voice change.   Respiratory: Negative for apnea, cough, choking, chest tightness, shortness of breath, wheezing and stridor.   Cardiovascular: Negative for chest pain, palpitations and leg swelling.       Bilateral lower leg swelling. Elevated blood pressure today.   Gastrointestinal: Negative for abdominal  distention, abdominal pain, anal bleeding, blood in stool, constipation, diarrhea, nausea and vomiting.  Endocrine: Negative for cold intolerance, heat intolerance, polydipsia, polyphagia and polyuria.  Musculoskeletal: Negative for arthralgias and gait problem.  Skin: Negative for rash.  Allergic/Immunologic: Positive for environmental allergies.       Seasonal allergies  Neurological: Positive for headaches. Negative for dizziness, seizures, syncope, facial asymmetry, speech difficulty, weakness, light-headedness and numbness.       Mild intermittent headache  Hematological: Negative for adenopathy.  Psychiatric/Behavioral: Negative for agitation, behavioral problems and confusion. The patient is nervous/anxious.    Today's Vitals   10/11/18 1446  BP: (!) 168/88  Pulse: 72  Resp: 16  SpO2:  97%  Weight: 212 lb 12.8 oz (96.5 kg)  Height: 5' 6.5" (1.689 m)    Physical Exam  Constitutional: She is oriented to person, place, and time. She appears well-developed and well-nourished. No distress.  HENT:  Head: Normocephalic and atraumatic.  Right Ear: External ear normal.  Left Ear: External ear normal.  Nose: Nose normal.  Mouth/Throat: Oropharynx is clear and moist. No oropharyngeal exudate.  Eyes: Conjunctivae are normal. Right eye exhibits no discharge. Left eye exhibits no discharge. No scleral icterus.  Neck: No tracheal deviation present. No thyromegaly present.  Cardiovascular: Normal rate and normal heart sounds. Exam reveals no gallop and no friction rub.  Pulmonary/Chest: Effort normal and breath sounds normal. No respiratory distress. She has no wheezes. She has no rales. She exhibits no tenderness.  Abdominal: Soft. Bowel sounds are normal. She exhibits no distension and no mass. There is no tenderness. There is no rebound. No hernia.  Musculoskeletal: She exhibits edema and tenderness. She exhibits no deformity.  Bilateral lower extremities edema due to the past injury    Lymphadenopathy:    She has no cervical adenopathy.  Neurological: She is alert and oriented to person, place, and time.  Skin: Skin is warm and dry. No rash noted. She is not diaphoretic. No erythema. No pallor.  Psychiatric: Her speech is normal and behavior is normal. Judgment and thought content normal. Her mood appears anxious. Cognition and memory are normal.  Nursing note and vitals reviewed.  Assessment/Plan: 1. Essential hypertension ncreased BP for the past two visits, increase coreg to 12.5mg  tablets twice daily. Continue with increased dose lisinopril limit salt intake. Increase water intake.  - carvedilol (COREG) 12.5 MG tablet; Take 1 tablet (12.5 mg total) by mouth 2 (two) times daily with a meal.  Dispense: 180 tablet; Refill: 3  2. Abnormal renal function Encouraged patient to increase water intake. Recheck bmp at next visit.   3. Acquired hypothyroidism Thyroid panel stable. Continue levothyroxine at current dose.   General Counseling: Brittny verbalizes understanding of the findings of todays visit and agrees with plan of treatment. I have discussed any further diagnostic evaluation that may be needed or ordered today. We also reviewed her medications today. she has been encouraged to call the office with any questions or concerns that should arise related to todays visit.    Hypertension Counseling:   The following hypertensive lifestyle modification were recommended and discussed:  1. Limiting alcohol intake to less than 1 oz/day of ethanol:(24 oz of beer or 8 oz of wine or 2 oz of 100-proof whiskey). 2. Take baby ASA 81 mg daily. 3. Importance of regular aerobic exercise and losing weight. 4. Reduce dietary saturated fat and cholesterol intake for overall cardiovascular health. 5. Maintaining adequate dietary potassium, calcium, and magnesium intake. 6. Regular monitoring of the blood pressure. 7. Reduce sodium intake to less than 100 mmol/day (less than 2.3 gm of  sodium or less than 6 gm of sodium choride)   This patient was seen by [place] with Dr Lavera Guise as a part of collaborative care agreement  Meds ordered this encounter  Medications  . carvedilol (COREG) 12.5 MG tablet    Sig: Take 1 tablet (12.5 mg total) by mouth 2 (two) times daily with a meal.    Dispense:  180 tablet    Refill:  3    Please note increased dose    Order Specific Question:   Supervising Provider    Answer:  Lavera Guise [[id number]]    Time spent: 25 Minutes      Dr Lavera Guise Internal medicine

## 2018-10-12 DIAGNOSIS — N289 Disorder of kidney and ureter, unspecified: Secondary | ICD-10-CM | POA: Insufficient documentation

## 2018-10-15 ENCOUNTER — Other Ambulatory Visit: Payer: Self-pay

## 2018-10-15 DIAGNOSIS — I10 Essential (primary) hypertension: Secondary | ICD-10-CM

## 2018-10-15 MED ORDER — CARVEDILOL 12.5 MG PO TABS: 12.5000 mg | ORAL_TABLET | Freq: Two times a day (BID) | ORAL | 0 refills | Status: DC

## 2018-10-22 ENCOUNTER — Other Ambulatory Visit: Payer: Self-pay

## 2018-10-22 DIAGNOSIS — R609 Edema, unspecified: Secondary | ICD-10-CM

## 2018-10-22 MED ORDER — FUROSEMIDE 20 MG PO TABS: 40.0000 mg | ORAL_TABLET | Freq: Every day | ORAL | 3 refills | Status: DC

## 2018-11-07 ENCOUNTER — Ambulatory Visit (INDEPENDENT_AMBULATORY_CARE_PROVIDER_SITE_OTHER): Payer: Medicare HMO | Admitting: Nurse Practitioner

## 2018-11-07 ENCOUNTER — Encounter: Payer: Self-pay | Admitting: Nurse Practitioner

## 2018-11-07 VITALS — BP 187/96 | HR 64 | Resp 16 | Ht 66.5 in | Wt 214.0 lb

## 2018-11-07 DIAGNOSIS — L02225 Furuncle of perineum: Secondary | ICD-10-CM

## 2018-11-07 DIAGNOSIS — I10 Essential (primary) hypertension: Secondary | ICD-10-CM

## 2018-11-07 MED ORDER — DOXYCYCLINE HYCLATE 100 MG PO TABS: 100.0000 mg | ORAL_TABLET | Freq: Two times a day (BID) | ORAL | 0 refills | Status: DC

## 2018-11-07 NOTE — Progress Notes (Signed)
[facility] [place], [place] [address]  Internal MEDICINE  Office Visit Note  Patient Name: Nancy Moyer  [id number]  [id number]  Date of Service: 11/13/2018   Pt is here for a sick visit.  Chief Complaint  Patient presents with  . Vaginal Pain    pt has a knot on the vagina that is painful and has been leaking fluids, she mentioned that she noticed it on sunday and it does not affect her urinating.      The patient is here for sick visit. Today, she states she ha noted a growth, or lump on the left labia of the vagina. Has been getting bigger over the past few days. Tender to palpate. Yesterday, she started feeling drainage coming from the growth. Once drainage started, the tenderness started to improve. She states that she has not used any new soaps or detergents or body products. She has noted increased blood pressure, which generally happens when she has infection of some sort.        Current Medication:  Outpatient Encounter Medications as of 11/07/2018  Medication Sig  . albuterol (PROVENTIL HFA) 108 (90 Base) MCG/ACT inhaler Inhale into the lungs.  Marland Kitchen alendronate (FOSAMAX) 70 MG tablet TAKE 1 TABLET EVERY WEEK AS DIRECTED  (STOP  BONIVA)  . Calcium Carbonate-Vitamin D 600-400 MG-UNIT tablet Take by mouth.  . DULoxetine (CYMBALTA) 30 MG capsule Take 30 mg by mouth daily.  Marland Kitchen ELIQUIS 5 MG TABS tablet Take 1 tablet (5 mg total) by mouth 2 (two) times daily.  . Fluticasone-Salmeterol (ADVAIR) 100-50 MCG/DOSE AEPB Inhale 1 puff into the lungs 2 (two) times daily.  . furosemide (LASIX) 20 MG tablet Take 2 tablets (40 mg total) by mouth daily.  Marland Kitchen levothyroxine (SYNTHROID, LEVOTHROID) 100 MCG tablet Take 1 tablet (100 mcg total) by mouth daily before breakfast.  . lisinopril (PRINIVIL,ZESTRIL) 20 MG tablet Take 1 tablet (20 mg total) by mouth daily.  Marland Kitchen lovastatin (MEVACOR) 10 MG tablet TAKE 1 TABLET AT BEDTIME FOR CHOLESTEROL  . meloxicam (MOBIC) 15  MG tablet TAKE 1 TABLET EVERY DAY  . mometasone (NASONEX) 50 MCG/ACT nasal spray Place into the nose.  . montelukast (SINGULAIR) 10 MG tablet Take 1 tablet (10 mg total) by mouth daily.  . Multiple Vitamin (MULTI-VITAMINS) TABS Take by mouth.  . nitrofurantoin, macrocrystal-monohydrate, (MACROBID) 100 MG capsule Take 1 capsule (100 mg total) by mouth 2 (two) times daily.  Marland Kitchen omeprazole (PRILOSEC) 20 MG capsule Take 1 capsule (20 mg total) by mouth daily.  . potassium chloride (K-DUR,KLOR-CON) 10 MEQ tablet Take by mouth.  . triamcinolone (KENALOG) 0.025 % cream   . carvedilol (COREG) 12.5 MG tablet Take 1 tablet (12.5 mg total) by mouth 2 (two) times daily with a meal for 14 days.  Marland Kitchen doxycycline (VIBRA-TABS) 100 MG tablet Take 1 tablet (100 mg total) by mouth 2 (two) times daily.   No facility-administered encounter medications on file as of 11/07/2018.       Medical History: Past Medical History:  Diagnosis Date  . Deep venous thrombosis ([place])   . Hyperlipidemia   . Hypertension   . Pulmonary embolism ([place])   . Thyroid disease      Today's Vitals   11/07/18 1358  BP: (!) 187/96  Pulse: 64  Resp: 16  SpO2: 98%  Weight: 214 lb (97.1 kg)  Height: 5' 6.5" (1.689 m)    Review of Systems  Constitutional: Positive for fatigue. Negative for chills and  unexpected weight change.  HENT: Negative for congestion, postnasal drip, rhinorrhea, sneezing and sore throat.   Respiratory: Negative for cough, chest tightness, shortness of breath and wheezing.   Cardiovascular: Positive for leg swelling. Negative for chest pain and palpitations.       Elevated blood pressure today.  Gastrointestinal: Negative for abdominal pain, constipation, diarrhea, nausea and vomiting.  Genitourinary: Positive for genital sores. Negative for dysuria and frequency.  Musculoskeletal: Negative for arthralgias, back pain, joint swelling and neck pain.  Skin: Negative for rash.  Allergic/Immunologic: Negative  for environmental allergies.  Neurological: Positive for headaches. Negative for tremors and numbness.  Hematological: Negative for adenopathy. Does not bruise/bleed easily.  Psychiatric/Behavioral: Negative for behavioral problems (Depression), sleep disturbance and suicidal ideas. The patient is nervous/anxious.     Physical Exam  Constitutional: She is oriented to person, place, and time. She appears well-developed and well-nourished. No distress.  HENT:  Head: Normocephalic and atraumatic.  Mouth/Throat: No oropharyngeal exudate.  Eyes: Pupils are equal, round, and reactive to light. EOM are normal.  Neck: Normal range of motion. Neck supple. No JVD present. No tracheal deviation present. No thyromegaly present.  Cardiovascular: Normal rate, regular rhythm and normal heart sounds. Exam reveals no gallop and no friction rub.  No murmur heard. Pulmonary/Chest: Effort normal and breath sounds normal. No respiratory distress. She has no wheezes. She has no rales. She exhibits no tenderness.  Abdominal: Soft. Bowel sounds are normal.  Genitourinary:     Musculoskeletal: Normal range of motion.  Lymphadenopathy:    She has no cervical adenopathy.  Neurological: She is alert and oriented to person, place, and time. No cranial nerve deficit.  Skin: Skin is warm and dry. She is not diaphoretic.  Psychiatric: Her speech is normal and behavior is normal. Judgment and thought content normal. Her mood appears anxious. Cognition and memory are normal.  Nursing note and vitals reviewed.  Assessment/Plan: 1. Furuncle of perineum Start doxycycline 100mg  bid for 14 days. Advised she apply warm moist compress to the area fo facilitate drainage.  - doxycycline (VIBRA-TABS) 100 MG tablet; Take 1 tablet (100 mg total) by mouth 2 (two) times daily.  Dispense: 28 tablet; Refill: 0  2. Essential hypertension Will monitor. Plan to adjust medications if still elevated at next visit.   General  Counseling: [name] verbalizes understanding of the findings of todays visit and agrees with plan of treatment. I have discussed any further diagnostic evaluation that may be needed or ordered today. We also reviewed her medications today. she has been encouraged to call the office with any questions or concerns that should arise related to todays visit.    Counseling:  This patient was seen by [place] with Dr Lavera Guise as a part of collaborative care agreement  Meds ordered this encounter  Medications  . doxycycline (VIBRA-TABS) 100 MG tablet    Sig: Take 1 tablet (100 mg total) by mouth 2 (two) times daily.    Dispense:  28 tablet    Refill:  0    Order Specific Question:   Supervising Provider    Answer:   Lavera Guise [[id number]]    Time spent: 25 Minutes

## 2018-11-13 DIAGNOSIS — L02225 Furuncle of perineum: Secondary | ICD-10-CM | POA: Insufficient documentation

## 2018-11-19 ENCOUNTER — Ambulatory Visit (INDEPENDENT_AMBULATORY_CARE_PROVIDER_SITE_OTHER): Payer: Medicare HMO | Admitting: Nurse Practitioner

## 2018-11-19 ENCOUNTER — Encounter: Payer: Self-pay | Admitting: Nurse Practitioner

## 2018-11-19 VITALS — BP 145/92 | HR 72 | Resp 16 | Ht 66.5 in | Wt 214.0 lb

## 2018-11-19 DIAGNOSIS — Z23 Encounter for immunization: Secondary | ICD-10-CM | POA: Diagnosis not present

## 2018-11-19 DIAGNOSIS — E785 Hyperlipidemia, unspecified: Secondary | ICD-10-CM | POA: Diagnosis not present

## 2018-11-19 DIAGNOSIS — E039 Hypothyroidism, unspecified: Secondary | ICD-10-CM

## 2018-11-19 DIAGNOSIS — I10 Essential (primary) hypertension: Secondary | ICD-10-CM | POA: Diagnosis not present

## 2018-11-19 DIAGNOSIS — L02225 Furuncle of perineum: Secondary | ICD-10-CM | POA: Diagnosis not present

## 2018-11-19 MED ORDER — CARVEDILOL 12.5 MG PO TABS
12.5000 mg | ORAL_TABLET | Freq: Two times a day (BID) | ORAL | 3 refills | Status: DC
Start: 2018-11-19 — End: 2019-02-18

## 2018-11-19 MED ORDER — ZOSTER VAC RECOMB ADJUVANTED 50 MCG/0.5ML IM SUSR: INTRAMUSCULAR | 1 refills | Status: DC

## 2018-11-19 MED ORDER — LISINOPRIL 20 MG PO TABS: 20.0000 mg | ORAL_TABLET | Freq: Every day | ORAL | 3 refills | Status: DC

## 2018-11-19 NOTE — Progress Notes (Signed)
[facility] [place], [place] [address]  Internal MEDICINE  Office Visit Note  Patient Name: Nancy Moyer  [id number]  [id number]  Date of Service: 11/20/2018  Chief Complaint  Patient presents with  . Medical Management of Chronic Issues    6wk follow up, increased doreg dosage, medication refills  . Quality Metric Gaps    pt have questions about pneumovax, shingles vaccine    Patient is here for follow up visit. Treated for abscess located on left labia at her last visit. Has four more days of antibiotics to complete. States that abscess has improved significantly. Has drained a great deal of blood tinged fluid. Lesion is much smaller now and is no longer tender.  Blood pressure was elevated at her past two visits. Increased her coreg to 12.5mg  tablets twice daily. Blood pressure is improved today and patient states that she is feeling better, overall.       Current Medication: Outpatient Encounter Medications as of 11/19/2018  Medication Sig  . albuterol (PROVENTIL HFA) 108 (90 Base) MCG/ACT inhaler Inhale into the lungs.  Marland Kitchen alendronate (FOSAMAX) 70 MG tablet TAKE 1 TABLET EVERY WEEK AS DIRECTED  (STOP  BONIVA)  . Calcium Carbonate-Vitamin D 600-400 MG-UNIT tablet Take by mouth.  . doxycycline (VIBRA-TABS) 100 MG tablet Take 1 tablet (100 mg total) by mouth 2 (two) times daily.  . DULoxetine (CYMBALTA) 30 MG capsule Take 30 mg by mouth daily.  Marland Kitchen ELIQUIS 5 MG TABS tablet Take 1 tablet (5 mg total) by mouth 2 (two) times daily.  . Fluticasone-Salmeterol (ADVAIR) 100-50 MCG/DOSE AEPB Inhale 1 puff into the lungs 2 (two) times daily.  . furosemide (LASIX) 20 MG tablet Take 2 tablets (40 mg total) by mouth daily.  Marland Kitchen levothyroxine (SYNTHROID, LEVOTHROID) 100 MCG tablet Take 1 tablet (100 mcg total) by mouth daily before breakfast.  . lisinopril (PRINIVIL,ZESTRIL) 20 MG tablet Take 1 tablet (20 mg total) by mouth daily.  Marland Kitchen lovastatin (MEVACOR) 10 MG  tablet TAKE 1 TABLET AT BEDTIME FOR CHOLESTEROL  . meloxicam (MOBIC) 15 MG tablet TAKE 1 TABLET EVERY DAY  . mometasone (NASONEX) 50 MCG/ACT nasal spray Place into the nose.  . montelukast (SINGULAIR) 10 MG tablet Take 1 tablet (10 mg total) by mouth daily.  . Multiple Vitamin (MULTI-VITAMINS) TABS Take by mouth.  . nitrofurantoin, macrocrystal-monohydrate, (MACROBID) 100 MG capsule Take 1 capsule (100 mg total) by mouth 2 (two) times daily.  Marland Kitchen omeprazole (PRILOSEC) 20 MG capsule Take 1 capsule (20 mg total) by mouth daily.  . potassium chloride (K-DUR,KLOR-CON) 10 MEQ tablet Take by mouth.  . triamcinolone (KENALOG) 0.025 % cream   . [DISCONTINUED] lisinopril (PRINIVIL,ZESTRIL) 20 MG tablet Take 1 tablet (20 mg total) by mouth daily.  . carvedilol (COREG) 12.5 MG tablet Take 1 tablet (12.5 mg total) by mouth 2 (two) times daily with a meal.  . Zoster Vaccine Adjuvanted [facility]) injection Shingles vaccine series - inject IM as directed .  . [DISCONTINUED] carvedilol (COREG) 12.5 MG tablet Take 1 tablet (12.5 mg total) by mouth 2 (two) times daily with a meal for 14 days.   No facility-administered encounter medications on file as of 11/19/2018.     Surgical History: Past Surgical History:  Procedure Laterality Date  . DILATATION & CURETTAGE/HYSTEROSCOPY WITH MYOSURE    . ENDOMETRIAL BIOPSY    . left malleolar fracture    . uterine biopsy    . [place]  History: Past Medical History:  Diagnosis Date  . Deep venous thrombosis ([place])   . Hyperlipidemia   . Hypertension   . Pulmonary embolism ([place])   . Thyroid disease     Family History: Family History  Problem Relation Age of Onset  . Diabetes Father   . Lung cancer Brother   . Alcohol abuse Maternal Grandmother   . Liver cancer Maternal Grandmother   . Diabetes Paternal Grandfather     Social History   Socioeconomic History  . Marital status: Widowed    Spouse name: Not on file  . Number of  children: Not on file  . Years of education: Not on file  . Highest education level: Not on file  Occupational History  . Not on file  Social Needs  . Financial resource strain: Not on file  . Food insecurity:    Worry: Not on file    Inability: Not on file  . Transportation needs:    Medical: Not on file    Non-medical: Not on file  Tobacco Use  . Smoking status: Never Smoker  . Smokeless tobacco: Current User  Substance and Sexual Activity  . Alcohol use: No    Frequency: Never  . Drug use: No  . Sexual activity: Not on file  Lifestyle  . Physical activity:    Days per week: Not on file    Minutes per session: Not on file  . Stress: Not on file  Relationships  . Social connections:    Talks on phone: Not on file    Gets together: Not on file    Attends religious service: Not on file    Active member of club or organization: Not on file    Attends meetings of clubs or organizations: Not on file    Relationship status: Not on file  . Intimate partner violence:    Fear of current or ex partner: Not on file    Emotionally abused: Not on file    Physically abused: Not on file    Forced sexual activity: Not on file  Other Topics Concern  . Not on file  Social History Narrative  . Not on file      Review of Systems  Constitutional: Positive for fatigue. Negative for chills and unexpected weight change.  HENT: Positive for congestion. Negative for postnasal drip, rhinorrhea, sneezing and sore throat.   Respiratory: Negative for cough, chest tightness, shortness of breath and wheezing.   Cardiovascular: Positive for leg swelling. Negative for chest pain and palpitations.       Improved blood pressure.   Gastrointestinal: Negative for abdominal pain, constipation, diarrhea, nausea and vomiting.  Endocrine: Negative for cold intolerance, heat intolerance, polydipsia and polyuria.  Genitourinary: Positive for genital sores. Negative for dysuria and frequency.        Lesion on left labia is much smaller and is no longer painful.   Musculoskeletal: Negative for arthralgias, back pain, joint swelling and neck pain.  Skin: Negative for rash.  Allergic/Immunologic: Negative for environmental allergies.  Neurological: Positive for headaches. Negative for tremors and numbness.  Hematological: Negative for adenopathy. Does not bruise/bleed easily.  Psychiatric/Behavioral: Negative for behavioral problems (Depression), sleep disturbance and suicidal ideas. The patient is nervous/anxious.     Today's Vitals   11/19/18 1210  BP: (!) 145/92  Pulse: 72  Resp: 16  SpO2: 95%  Weight: 214 lb (97.1 kg)  Height: 5' 6.5" (1.689 m)    Physical Exam Vitals signs and nursing  note reviewed.  Constitutional:      General: She is not in acute distress.    Appearance: Normal appearance. She is well-developed. She is not diaphoretic.  HENT:     Head: Normocephalic and atraumatic.     Nose: Nose normal.     Mouth/Throat:     Pharynx: No oropharyngeal exudate.  Eyes:     Pupils: Pupils are equal, round, and reactive to light.  Neck:     Musculoskeletal: Normal range of motion and neck supple.     Thyroid: No thyromegaly.     Vascular: No JVD.     Trachea: No tracheal deviation.  Cardiovascular:     Rate and Rhythm: Normal rate and regular rhythm.     Heart sounds: Normal heart sounds. No murmur. No friction rub. No gallop.   Pulmonary:     Effort: Pulmonary effort is normal. No respiratory distress.     Breath sounds: Normal breath sounds. No wheezing or rales.  Chest:     Chest wall: No tenderness.  Abdominal:     General: Bowel sounds are normal.     Palpations: Abdomen is soft.  Musculoskeletal: Normal range of motion.  Lymphadenopathy:     Cervical: No cervical adenopathy.  Skin:    General: Skin is warm and dry.  Neurological:     General: No focal deficit present.     Mental Status: She is alert and oriented to person, place, and time. Mental  status is at baseline.     Cranial Nerves: No cranial nerve deficit.  Psychiatric:        Attention and Perception: Attention and perception normal.        Mood and Affect: Affect normal. Mood is anxious.        Speech: Speech normal.        Behavior: Behavior normal.        Thought Content: Thought content normal.        Cognition and Memory: Cognition and memory normal.        Judgment: Judgment normal.    Assessment/Plan: 1. Essential hypertension Improved. Continue coreg 12.5mg  twice daily and lisinopril 20mg  daily. Limit salt intake and increase intake of water.  - carvedilol (COREG) 12.5 MG tablet; Take 1 tablet (12.5 mg total) by mouth 2 (two) times daily with a meal.  Dispense: 180 tablet; Refill: 3 - lisinopril (PRINIVIL,ZESTRIL) 20 MG tablet; Take 1 tablet (20 mg total) by mouth daily.  Dispense: 90 tablet; Refill: 3  2. Furuncle of perineum Improving. Continue antibiotics until complete.   3. Acquired hypothyroidism Thyroid panel stable. Continue levothyroxine as prescribed.    4. Hyperlipidemia, unspecified hyperlipidemia type Continue lovastatin as prescribed.   5. Need for shingles vaccine - Zoster Vaccine Adjuvanted [facility]) injection; Shingles vaccine series - inject IM as directed .  Dispense: 0.5 mL; Refill: 1  6. Need for vaccination against Streptococcus pneumoniae using pneumococcal conjugate vaccine 13 - Pneumococcal conjugate vaccine 13-valent IM  General Counseling: Yaslin verbalizes understanding of the findings of todays visit and agrees with plan of treatment. I have discussed any further diagnostic evaluation that may be needed or ordered today. We also reviewed her medications today. she has been encouraged to call the office with any questions or concerns that should arise related to todays visit.  Hypertension Counseling:   The following hypertensive lifestyle modification were recommended and discussed:  1. Limiting alcohol intake to less than  1 oz/day of ethanol:(24 oz of beer or 8 oz  of wine or 2 oz of 100-proof whiskey). 2. Take baby ASA 81 mg daily. 3. Importance of regular aerobic exercise and losing weight. 4. Reduce dietary saturated fat and cholesterol intake for overall cardiovascular health. 5. Maintaining adequate dietary potassium, calcium, and magnesium intake. 6. Regular monitoring of the blood pressure. 7. Reduce sodium intake to less than 100 mmol/day (less than 2.3 gm of sodium or less than 6 gm of sodium choride)   This patient was seen by [place] with Dr Lavera Guise as a part of collaborative care agreement  Orders Placed This Encounter  Procedures  . Pneumococcal conjugate vaccine 13-valent IM    Meds ordered this encounter  Medications  . Zoster Vaccine Adjuvanted [facility]) injection    Sig: Shingles vaccine series - inject IM as directed .    Dispense:  0.5 mL    Refill:  1    Order Specific Question:   Supervising Provider    Answer:   Lavera Guise [[id number]]  . carvedilol (COREG) 12.5 MG tablet    Sig: Take 1 tablet (12.5 mg total) by mouth 2 (two) times daily with a meal.    Dispense:  180 tablet    Refill:  3    Please note increased dose    Order Specific Question:   Supervising Provider    Answer:   Lavera Guise [place]  . lisinopril (PRINIVIL,ZESTRIL) 20 MG tablet    Sig: Take 1 tablet (20 mg total) by mouth daily.    Dispense:  90 tablet    Refill:  3    Order Specific Question:   Supervising Provider    Answer:   Lavera Guise [[id number]]    Time spent: 71 Minutes      Dr Lavera Guise Internal medicine

## 2018-11-20 DIAGNOSIS — E785 Hyperlipidemia, unspecified: Secondary | ICD-10-CM | POA: Insufficient documentation

## 2018-11-20 DIAGNOSIS — Z23 Encounter for immunization: Secondary | ICD-10-CM | POA: Insufficient documentation

## 2018-11-25 ENCOUNTER — Ambulatory Visit: Payer: Self-pay | Admitting: Nurse Practitioner

## 2018-12-09 ENCOUNTER — Ambulatory Visit: Payer: Self-pay | Admitting: Nurse Practitioner

## 2018-12-25 ENCOUNTER — Other Ambulatory Visit: Payer: Self-pay

## 2018-12-25 MED ORDER — LOVASTATIN 10 MG PO TABS: ORAL_TABLET | ORAL | 2 refills | Status: DC

## 2019-01-16 DIAGNOSIS — H2513 Age-related nuclear cataract, bilateral: Secondary | ICD-10-CM | POA: Diagnosis not present

## 2019-02-09 ENCOUNTER — Other Ambulatory Visit: Payer: Self-pay | Admitting: Adult Health

## 2019-02-09 DIAGNOSIS — R300 Dysuria: Secondary | ICD-10-CM

## 2019-02-09 DIAGNOSIS — E785 Hyperlipidemia, unspecified: Secondary | ICD-10-CM

## 2019-02-09 DIAGNOSIS — I10 Essential (primary) hypertension: Secondary | ICD-10-CM

## 2019-02-09 DIAGNOSIS — R519 Headache, unspecified: Secondary | ICD-10-CM

## 2019-02-09 DIAGNOSIS — R51 Headache: Secondary | ICD-10-CM

## 2019-02-09 DIAGNOSIS — Z86718 Personal history of other venous thrombosis and embolism: Secondary | ICD-10-CM

## 2019-02-09 DIAGNOSIS — Z0001 Encounter for general adult medical examination with abnormal findings: Secondary | ICD-10-CM

## 2019-02-09 DIAGNOSIS — E039 Hypothyroidism, unspecified: Secondary | ICD-10-CM

## 2019-02-09 DIAGNOSIS — I890 Lymphedema, not elsewhere classified: Secondary | ICD-10-CM

## 2019-02-18 ENCOUNTER — Ambulatory Visit (INDEPENDENT_AMBULATORY_CARE_PROVIDER_SITE_OTHER): Payer: Medicare Other | Admitting: Adult Health

## 2019-02-18 ENCOUNTER — Other Ambulatory Visit: Payer: Self-pay

## 2019-02-18 ENCOUNTER — Encounter: Payer: Self-pay | Admitting: Nurse Practitioner

## 2019-02-18 VITALS — BP 148/82 | HR 61 | Resp 16 | Ht 66.5 in | Wt 216.8 lb

## 2019-02-18 DIAGNOSIS — E785 Hyperlipidemia, unspecified: Secondary | ICD-10-CM | POA: Diagnosis not present

## 2019-02-18 DIAGNOSIS — J309 Allergic rhinitis, unspecified: Secondary | ICD-10-CM

## 2019-02-18 DIAGNOSIS — J4520 Mild intermittent asthma, uncomplicated: Secondary | ICD-10-CM

## 2019-02-18 DIAGNOSIS — I10 Essential (primary) hypertension: Secondary | ICD-10-CM

## 2019-02-18 DIAGNOSIS — R51 Headache: Secondary | ICD-10-CM

## 2019-02-18 DIAGNOSIS — Z86718 Personal history of other venous thrombosis and embolism: Secondary | ICD-10-CM | POA: Diagnosis not present

## 2019-02-18 DIAGNOSIS — IMO0001 Reserved for inherently not codable concepts without codable children: Secondary | ICD-10-CM

## 2019-02-18 DIAGNOSIS — K219 Gastro-esophageal reflux disease without esophagitis: Secondary | ICD-10-CM

## 2019-02-18 DIAGNOSIS — E039 Hypothyroidism, unspecified: Secondary | ICD-10-CM

## 2019-02-18 DIAGNOSIS — R519 Headache, unspecified: Secondary | ICD-10-CM

## 2019-02-18 DIAGNOSIS — R609 Edema, unspecified: Secondary | ICD-10-CM

## 2019-02-18 MED ORDER — ELIQUIS 5 MG PO TABS: 5.0000 mg | ORAL_TABLET | Freq: Two times a day (BID) | ORAL | 3 refills | Status: DC

## 2019-02-18 MED ORDER — FUROSEMIDE 20 MG PO TABS: 40.0000 mg | ORAL_TABLET | Freq: Every day | ORAL | 3 refills | Status: AC

## 2019-02-18 MED ORDER — POTASSIUM CHLORIDE CRYS ER 10 MEQ PO TBCR: 10.0000 meq | EXTENDED_RELEASE_TABLET | Freq: Every day | ORAL | 1 refills | Status: AC

## 2019-02-18 MED ORDER — MONTELUKAST SODIUM 10 MG PO TABS: 10.0000 mg | ORAL_TABLET | Freq: Every day | ORAL | 1 refills | Status: DC

## 2019-02-18 MED ORDER — MELOXICAM 15 MG PO TABS: 15.0000 mg | ORAL_TABLET | Freq: Every day | ORAL | 6 refills | Status: DC

## 2019-02-18 MED ORDER — LOVASTATIN 10 MG PO TABS: ORAL_TABLET | ORAL | 2 refills | Status: DC

## 2019-02-18 MED ORDER — ALENDRONATE SODIUM 70 MG PO TABS: ORAL_TABLET | ORAL | 2 refills | Status: DC

## 2019-02-18 MED ORDER — OMEPRAZOLE 20 MG PO CPDR: 20.0000 mg | DELAYED_RELEASE_CAPSULE | Freq: Every day | ORAL | 2 refills | Status: AC

## 2019-02-18 MED ORDER — MOMETASONE FUROATE 50 MCG/ACT NA SUSP: 2.0000 | Freq: Every day | NASAL | 1 refills | Status: DC

## 2019-02-18 MED ORDER — DULOXETINE HCL 30 MG PO CPEP: 30.0000 mg | ORAL_CAPSULE | Freq: Every day | ORAL | 2 refills | Status: DC

## 2019-02-18 MED ORDER — LISINOPRIL 20 MG PO TABS: 20.0000 mg | ORAL_TABLET | Freq: Every day | ORAL | 3 refills | Status: DC

## 2019-02-18 MED ORDER — CARVEDILOL 12.5 MG PO TABS: 12.5000 mg | ORAL_TABLET | Freq: Two times a day (BID) | ORAL | 3 refills | Status: DC

## 2019-02-18 MED ORDER — ALBUTEROL SULFATE HFA 108 (90 BASE) MCG/ACT IN AERS: 2.0000 | INHALATION_SPRAY | RESPIRATORY_TRACT | 3 refills | Status: AC | PRN

## 2019-02-18 MED ORDER — LEVOTHYROXINE SODIUM 100 MCG PO TABS: 100.0000 ug | ORAL_TABLET | Freq: Every day | ORAL | 1 refills | Status: AC

## 2019-02-18 NOTE — Patient Instructions (Signed)

## 2019-02-18 NOTE — Progress Notes (Signed)
[facility] [place], [place] [address]  Internal MEDICINE  Office Visit Note  Patient Name: Nancy Moyer  [id number]  [id number]  Date of Service: 02/19/2019  Chief Complaint  Patient presents with  . Medical Management of Chronic Issues    3 month follow up , medication refill due to going to new insurance company, pt alsto mentioned she need blood work done to check thyroid level  . Hypertension  . Hyperlipidemia    HPI Pt is here for follow up on HTN and HLD.  Her insurance has changed and she is requesting her medications be sent to a new pharmacy per her insurance company. She has not had any laboratory studies performed since September of 2019. She is mostly concerned about her thyroid.  Will provide her with lab orders for tyroid level check at this time.    Current Medication: Outpatient Encounter Medications as of 02/18/2019  Medication Sig  . albuterol (PROVENTIL HFA) 108 (90 Base) MCG/ACT inhaler Inhale 2 puffs into the lungs every 4 (four) hours as needed for wheezing or shortness of breath.  Marland Kitchen alendronate (FOSAMAX) 70 MG tablet TAKE 1 TABLET EVERY WEEK AS DIRECTED  (STOP  BONIVA)  . Calcium Carbonate-Vitamin D 600-400 MG-UNIT tablet Take by mouth.  . carvedilol (COREG) 12.5 MG tablet Take 1 tablet (12.5 mg total) by mouth 2 (two) times daily with a meal.  . doxycycline (VIBRA-TABS) 100 MG tablet Take 1 tablet (100 mg total) by mouth 2 (two) times daily.  . DULoxetine (CYMBALTA) 30 MG capsule Take 1 capsule (30 mg total) by mouth daily.  Marland Kitchen ELIQUIS 5 MG TABS tablet Take 1 tablet (5 mg total) by mouth 2 (two) times daily.  . Fluticasone-Salmeterol (ADVAIR) 100-50 MCG/DOSE AEPB Inhale 1 puff into the lungs 2 (two) times daily.  . furosemide (LASIX) 20 MG tablet Take 2 tablets (40 mg total) by mouth daily.  Marland Kitchen levothyroxine (SYNTHROID, LEVOTHROID) 100 MCG tablet Take 1 tablet (100 mcg total) by mouth daily before breakfast.  . lisinopril  (PRINIVIL,ZESTRIL) 20 MG tablet Take 1 tablet (20 mg total) by mouth daily.  Marland Kitchen lovastatin (MEVACOR) 10 MG tablet TAKE 1 TABLET AT BEDTIME FOR CHOLESTEROL  . meloxicam (MOBIC) 15 MG tablet Take 1 tablet (15 mg total) by mouth daily.  . mometasone (NASONEX) 50 MCG/ACT nasal spray Place 2 sprays into the nose daily.  . montelukast (SINGULAIR) 10 MG tablet Take 1 tablet (10 mg total) by mouth daily.  . Multiple Vitamin (MULTI-VITAMINS) TABS Take by mouth.  Marland Kitchen omeprazole (PRILOSEC) 20 MG capsule Take 1 capsule (20 mg total) by mouth daily.  . potassium chloride (K-DUR,KLOR-CON) 10 MEQ tablet Take 1 tablet (10 mEq total) by mouth daily.  Marland Kitchen triamcinolone (KENALOG) 0.025 % cream   . [DISCONTINUED] albuterol (PROVENTIL HFA) 108 (90 Base) MCG/ACT inhaler Inhale into the lungs.  . [DISCONTINUED] alendronate (FOSAMAX) 70 MG tablet TAKE 1 TABLET EVERY WEEK AS DIRECTED  (STOP  BONIVA)  . [DISCONTINUED] carvedilol (COREG) 12.5 MG tablet Take 1 tablet (12.5 mg total) by mouth 2 (two) times daily with a meal.  . [DISCONTINUED] DULoxetine (CYMBALTA) 30 MG capsule Take 30 mg by mouth daily.  . [DISCONTINUED] ELIQUIS 5 MG TABS tablet TAKE 1 TABLET BY MOUTH TWICE A DAY  . [DISCONTINUED] furosemide (LASIX) 20 MG tablet Take 2 tablets (40 mg total) by mouth daily.  . [DISCONTINUED] levothyroxine (SYNTHROID, LEVOTHROID) 100 MCG tablet Take 1 tablet (100 mcg total) by mouth daily before breakfast.  . [  DISCONTINUED] lisinopril (PRINIVIL,ZESTRIL) 20 MG tablet Take 1 tablet (20 mg total) by mouth daily.  . [DISCONTINUED] lovastatin (MEVACOR) 10 MG tablet TAKE 1 TABLET AT BEDTIME FOR CHOLESTEROL  . [DISCONTINUED] meloxicam (MOBIC) 15 MG tablet TAKE 1 TABLET EVERY DAY  . [DISCONTINUED] mometasone (NASONEX) 50 MCG/ACT nasal spray Place into the nose.  . [DISCONTINUED] montelukast (SINGULAIR) 10 MG tablet Take 1 tablet (10 mg total) by mouth daily.  . [DISCONTINUED] omeprazole (PRILOSEC) 20 MG capsule Take 1 capsule (20 mg  total) by mouth daily.  . [DISCONTINUED] potassium chloride (K-DUR,KLOR-CON) 10 MEQ tablet Take by mouth.  . [DISCONTINUED] nitrofurantoin, macrocrystal-monohydrate, (MACROBID) 100 MG capsule Take 1 capsule (100 mg total) by mouth 2 (two) times daily. (Patient not taking: Reported on 02/18/2019)  . [DISCONTINUED] Zoster Vaccine Adjuvanted [facility]) injection Shingles vaccine series - inject IM as directed . (Patient not taking: Reported on 02/18/2019)   No facility-administered encounter medications on file as of 02/18/2019.     Surgical History: Past Surgical History:  Procedure Laterality Date  . DILATATION & CURETTAGE/HYSTEROSCOPY WITH MYOSURE    . ENDOMETRIAL BIOPSY    . left malleolar fracture    . uterine biopsy    . VARICOSE VEIN SURGERY      Medical History: Past Medical History:  Diagnosis Date  . Deep venous thrombosis ([place])   . Hyperlipidemia   . Hypertension   . Pulmonary embolism ([place])   . Thyroid disease     Family History: Family History  Problem Relation Age of Onset  . Diabetes Father   . Lung cancer Brother   . Alcohol abuse Maternal Grandmother   . Liver cancer Maternal Grandmother   . Diabetes Paternal Grandfather     Social History   Socioeconomic History  . Marital status: Widowed    Spouse name: Not on file  . Number of children: Not on file  . Years of education: Not on file  . Highest education level: Not on file  Occupational History  . Not on file  Social Needs  . Financial resource strain: Not on file  . Food insecurity:    Worry: Not on file    Inability: Not on file  . Transportation needs:    Medical: Not on file    Non-medical: Not on file  Tobacco Use  . Smoking status: Never Smoker  . Smokeless tobacco: Current User  Substance and Sexual Activity  . Alcohol use: No    Frequency: Never  . Drug use: No  . Sexual activity: Not on file  Lifestyle  . Physical activity:    Days per week: Not on file    Minutes per session:  Not on file  . Stress: Not on file  Relationships  . Social connections:    Talks on phone: Not on file    Gets together: Not on file    Attends religious service: Not on file    Active member of club or organization: Not on file    Attends meetings of clubs or organizations: Not on file    Relationship status: Not on file  . Intimate partner violence:    Fear of current or ex partner: Not on file    Emotionally abused: Not on file    Physically abused: Not on file    Forced sexual activity: Not on file  Other Topics Concern  . Not on file  Social History Narrative  . Not on file      Review of Systems  Constitutional: Negative for chills, fatigue and unexpected weight change.  HENT: Negative for congestion, rhinorrhea, sneezing and sore throat.   Eyes: Negative for photophobia, pain and redness.  Respiratory: Negative for cough, chest tightness and shortness of breath.   Cardiovascular: Negative for chest pain and palpitations.  Gastrointestinal: Negative for abdominal pain, constipation, diarrhea, nausea and vomiting.  Endocrine: Negative.   Genitourinary: Negative for dysuria and frequency.  Musculoskeletal: Negative for arthralgias, back pain, joint swelling and neck pain.  Skin: Negative for rash.  Allergic/Immunologic: Negative.   Neurological: Negative for tremors and numbness.  Hematological: Negative for adenopathy. Does not bruise/bleed easily.  Psychiatric/Behavioral: Negative for behavioral problems and sleep disturbance. The patient is not nervous/anxious.     Vital Signs: BP (!) 148/82 (BP Location: Right Arm, Patient Position: Sitting, Cuff Size: Large)   Pulse 61   Resp 16   Ht 5' 6.5" (1.689 m)   Wt 216 lb 12.8 oz (98.3 kg)   SpO2 94%   BMI 34.47 kg/m    Physical Exam Vitals signs and nursing note reviewed.  Constitutional:      General: She is not in acute distress.    Appearance: She is well-developed. She is not diaphoretic.  HENT:      Head: Normocephalic and atraumatic.     Mouth/Throat:     Pharynx: No oropharyngeal exudate.  Eyes:     Pupils: Pupils are equal, round, and reactive to light.  Neck:     Musculoskeletal: Normal range of motion and neck supple.     Thyroid: No thyromegaly.     Vascular: No JVD.     Trachea: No tracheal deviation.  Cardiovascular:     Rate and Rhythm: Normal rate and regular rhythm.     Heart sounds: Normal heart sounds. No murmur. No friction rub. No gallop.   Pulmonary:     Effort: Pulmonary effort is normal. No respiratory distress.     Breath sounds: Normal breath sounds. No wheezing or rales.  Chest:     Chest wall: No tenderness.  Abdominal:     Palpations: Abdomen is soft.     Tenderness: There is no abdominal tenderness. There is no guarding.  Musculoskeletal: Normal range of motion.  Lymphadenopathy:     Cervical: No cervical adenopathy.  Skin:    General: Skin is warm and dry.  Neurological:     Mental Status: She is alert and oriented to person, place, and time.     Cranial Nerves: No cranial nerve deficit.  Psychiatric:        Behavior: Behavior normal.        Thought Content: Thought content normal.        Judgment: Judgment normal.    Assessment/Plan: 1. Essential hypertension Pts bp slightly elevated today.  She has been sitting in ICU for the last month with her son.  Her bp today is 148/82, will continue to follow.  - carvedilol (COREG) 12.5 MG tablet; Take 1 tablet (12.5 mg total) by mouth 2 (two) times daily with a meal.  Dispense: 180 tablet; Refill: 3 - ELIQUIS 5 MG TABS tablet; Take 1 tablet (5 mg total) by mouth 2 (two) times daily.  Dispense: 60 tablet; Refill: 3 - lisinopril (PRINIVIL,ZESTRIL) 20 MG tablet; Take 1 tablet (20 mg total) by mouth daily.  Dispense: 90 tablet; Refill: 3  2. Hyperlipidemia, unspecified hyperlipidemia type Refilled patients medication.  Last lipid panel shows stability.   - lovastatin (MEVACOR) 10 MG tablet; TAKE 1 TABLET  AT BEDTIME FOR CHOLESTEROL  Dispense: 90 tablet; Refill: 2  3. Moderate intermittent asthma without complication Pts albuterol refilled at this time.  - albuterol (PROVENTIL HFA) 108 (90 Base) MCG/ACT inhaler; Inhale 2 puffs into the lungs every 4 (four) hours as needed for wheezing or shortness of breath.  Dispense: 1 Inhaler; Refill: 3  4. History of DVT (deep vein thrombosis) Refilled eliquis. - ELIQUIS 5 MG TABS tablet; Take 1 tablet (5 mg total) by mouth 2 (two) times daily.  Dispense: 60 tablet; Refill: 3  5. Acquired hypothyroidism Refilled patients synthroid. - levothyroxine (SYNTHROID, LEVOTHROID) 100 MCG tablet; Take 1 tablet (100 mcg total) by mouth daily before breakfast.  Dispense: 90 tablet; Refill: 1  6. Edema, unspecified type Continue lasix as prescribed.  - furosemide (LASIX) 20 MG tablet; Take 2 tablets (40 mg total) by mouth daily.  Dispense: 60 tablet; Refill: 3  7. Chronic allergic rhinitis Continue nasonex and singulair as prescribed.  - mometasone (NASONEX) 50 MCG/ACT nasal spray; Place 2 sprays into the nose daily.  Dispense: 17 g; Refill: 1 - montelukast (SINGULAIR) 10 MG tablet; Take 1 tablet (10 mg total) by mouth daily.  Dispense: 90 tablet; Refill: 1  8. Gastroesophageal reflux disease without esophagitis Continue prilosec as prescribed.  - omeprazole (PRILOSEC) 20 MG capsule; Take 1 capsule (20 mg total) by mouth daily.  Dispense: 90 capsule; Refill: 2  General Counseling: Dema verbalizes understanding of the findings of todays visit and agrees with plan of treatment. I have discussed any further diagnostic evaluation that may be needed or ordered today. We also reviewed her medications today. she has been encouraged to call the office with any questions or concerns that should arise related to todays visit.    No orders of the defined types were placed in this encounter.   Meds ordered this encounter  Medications  . albuterol (PROVENTIL HFA) 108  (90 Base) MCG/ACT inhaler    Sig: Inhale 2 puffs into the lungs every 4 (four) hours as needed for wheezing or shortness of breath.    Dispense:  1 Inhaler    Refill:  3  . alendronate (FOSAMAX) 70 MG tablet    Sig: TAKE 1 TABLET EVERY WEEK AS DIRECTED  (STOP  BONIVA)    Dispense:  12 tablet    Refill:  2  . carvedilol (COREG) 12.5 MG tablet    Sig: Take 1 tablet (12.5 mg total) by mouth 2 (two) times daily with a meal.    Dispense:  180 tablet    Refill:  3    Please note increased dose  . DULoxetine (CYMBALTA) 30 MG capsule    Sig: Take 1 capsule (30 mg total) by mouth daily.    Dispense:  90 capsule    Refill:  2  . ELIQUIS 5 MG TABS tablet    Sig: Take 1 tablet (5 mg total) by mouth 2 (two) times daily.    Dispense:  60 tablet    Refill:  3  . furosemide (LASIX) 20 MG tablet    Sig: Take 2 tablets (40 mg total) by mouth daily.    Dispense:  60 tablet    Refill:  3    Increase to 2 tablets daily for next few days, then go back to 20mg  daily  . levothyroxine (SYNTHROID, LEVOTHROID) 100 MCG tablet    Sig: Take 1 tablet (100 mcg total) by mouth daily before breakfast.    Dispense:  90 tablet    Refill:  1  . lisinopril (PRINIVIL,ZESTRIL) 20 MG tablet    Sig: Take 1 tablet (20 mg total) by mouth daily.    Dispense:  90 tablet    Refill:  3  . lovastatin (MEVACOR) 10 MG tablet    Sig: TAKE 1 TABLET AT BEDTIME FOR CHOLESTEROL    Dispense:  90 tablet    Refill:  2  . meloxicam (MOBIC) 15 MG tablet    Sig: Take 1 tablet (15 mg total) by mouth daily.    Dispense:  90 tablet    Refill:  6  . mometasone (NASONEX) 50 MCG/ACT nasal spray    Sig: Place 2 sprays into the nose daily.    Dispense:  17 g    Refill:  1  . montelukast (SINGULAIR) 10 MG tablet    Sig: Take 1 tablet (10 mg total) by mouth daily.    Dispense:  90 tablet    Refill:  1  . omeprazole (PRILOSEC) 20 MG capsule    Sig: Take 1 capsule (20 mg total) by mouth daily.    Dispense:  90 capsule    Refill:  2  .  potassium chloride (K-DUR,KLOR-CON) 10 MEQ tablet    Sig: Take 1 tablet (10 mEq total) by mouth daily.    Dispense:  90 tablet    Refill:  1    Time spent: 25 Minutes   This patient was seen by Orson Gear AGNP-C in Collaboration with Dr Lavera Guise as a part of collaborative care agreement     Kendell Bane AGNP-C Internal medicine

## 2019-02-18 NOTE — Progress Notes (Signed)
Pt has different insurance. Pt blood pressure taken manually  148/82 Informed provider.

## 2019-02-20 DIAGNOSIS — G4733 Obstructive sleep apnea (adult) (pediatric): Secondary | ICD-10-CM | POA: Diagnosis not present

## 2019-02-25 ENCOUNTER — Telehealth: Payer: Self-pay | Admitting: Nurse Practitioner

## 2019-02-25 NOTE — Telephone Encounter (Signed)
If I remember correctly, patient has been taking this combination of medication for long time. She is very aware of the possible interaction between the two medications, as far as bleeding. If she is aware, please just keep her on it. Anything we choosse has interaction with it.

## 2019-02-25 NOTE — Telephone Encounter (Signed)
Advised patient to stop taking meloxicam with taken eliquis, she wants to take something else for arthritis , was going to do celebrex but patient has a allergy to sulfa and celebrex has some in it. What would you like to do?

## 2019-02-26 NOTE — Telephone Encounter (Signed)
Pt is going to remain on the mobic

## 2019-04-25 ENCOUNTER — Other Ambulatory Visit: Payer: Self-pay

## 2019-04-25 ENCOUNTER — Telehealth: Payer: Self-pay

## 2019-04-25 MED ORDER — NITROFURANTOIN MONOHYD MACRO 100 MG PO CAPS: ORAL_CAPSULE | ORAL | 0 refills | Status: AC

## 2019-04-25 NOTE — Telephone Encounter (Signed)
Pt advised we send macrobid for uti as per [employer]

## 2019-05-02 ENCOUNTER — Other Ambulatory Visit: Payer: Self-pay

## 2019-05-07 ENCOUNTER — Telehealth: Payer: Self-pay | Admitting: Adult Health

## 2019-05-07 MED ORDER — MELOXICAM 15 MG PO TABS: 15.0000 mg | ORAL_TABLET | Freq: Every day | ORAL | 2 refills | Status: DC

## 2019-05-07 NOTE — Telephone Encounter (Signed)
Pt called and states insurance has changed and medication needs to be sent to a different pharmacy, patient states that she does not take the duloxetine and has not in several years and would like this to be d/c . New prescription sent to [facility] for patient

## 2019-05-15 ENCOUNTER — Other Ambulatory Visit: Payer: Self-pay | Admitting: Internal Medicine

## 2019-05-15 DIAGNOSIS — Z1231 Encounter for screening mammogram for malignant neoplasm of breast: Secondary | ICD-10-CM

## 2019-05-23 ENCOUNTER — Ambulatory Visit: Payer: Medicare Other | Admitting: Nurse Practitioner

## 2019-05-27 ENCOUNTER — Other Ambulatory Visit: Payer: Self-pay

## 2019-05-27 ENCOUNTER — Ambulatory Visit
Admission: RE | Admit: 2019-05-27 | Discharge: 2019-05-27 | Disposition: A | Discharge status: Home / Self Care | Payer: Medicare Other | Source: Ambulatory Visit | Attending: Internal Medicine | Admitting: Internal Medicine

## 2019-05-27 DIAGNOSIS — Z1231 Encounter for screening mammogram for malignant neoplasm of breast: Secondary | ICD-10-CM | POA: Diagnosis present

## 2019-06-25 ENCOUNTER — Other Ambulatory Visit: Payer: Self-pay | Admitting: Adult Health

## 2019-06-25 DIAGNOSIS — J309 Allergic rhinitis, unspecified: Secondary | ICD-10-CM

## 2019-08-14 ENCOUNTER — Other Ambulatory Visit: Payer: Self-pay

## 2019-08-14 ENCOUNTER — Other Ambulatory Visit
Admission: RE | Admit: 2019-08-14 | Discharge: 2019-08-14 | Disposition: A | Discharge status: Home / Self Care | Payer: Medicare Other | Source: Ambulatory Visit | Attending: Gastroenterology | Admitting: Gastroenterology

## 2019-08-14 DIAGNOSIS — Z01812 Encounter for preprocedural laboratory examination: Secondary | ICD-10-CM | POA: Diagnosis present

## 2019-08-14 DIAGNOSIS — Z20828 Contact with and (suspected) exposure to other viral communicable diseases: Secondary | ICD-10-CM | POA: Insufficient documentation

## 2019-08-14 LAB — SARS CORONAVIRUS 2 (TAT 6-24 HRS): SARS Coronavirus 2: NEGATIVE

## 2019-08-18 ENCOUNTER — Encounter
Admission: RE | Disposition: A | Discharge status: Home / Self Care | Payer: Self-pay | Source: Home / Self Care | Attending: Gastroenterology

## 2019-08-18 ENCOUNTER — Encounter: Payer: Self-pay | Admitting: *Deleted

## 2019-08-18 ENCOUNTER — Ambulatory Visit: Payer: Medicare Other | Admitting: Anesthesiology

## 2019-08-18 ENCOUNTER — Ambulatory Visit
Admission: RE | Admit: 2019-08-18 | Discharge: 2019-08-18 | Disposition: A | Discharge status: Home / Self Care | Payer: Medicare Other | Attending: Gastroenterology | Admitting: Gastroenterology

## 2019-08-18 DIAGNOSIS — R1032 Left lower quadrant pain: Secondary | ICD-10-CM | POA: Diagnosis not present

## 2019-08-18 DIAGNOSIS — I509 Heart failure, unspecified: Secondary | ICD-10-CM | POA: Insufficient documentation

## 2019-08-18 DIAGNOSIS — K5730 Diverticulosis of large intestine without perforation or abscess without bleeding: Secondary | ICD-10-CM | POA: Insufficient documentation

## 2019-08-18 DIAGNOSIS — I130 Hypertensive heart and chronic kidney disease with heart failure and stage 1 through stage 4 chronic kidney disease, or unspecified chronic kidney disease: Secondary | ICD-10-CM | POA: Diagnosis not present

## 2019-08-18 DIAGNOSIS — G4730 Sleep apnea, unspecified: Secondary | ICD-10-CM | POA: Insufficient documentation

## 2019-08-18 DIAGNOSIS — E785 Hyperlipidemia, unspecified: Secondary | ICD-10-CM | POA: Insufficient documentation

## 2019-08-18 DIAGNOSIS — Z7951 Long term (current) use of inhaled steroids: Secondary | ICD-10-CM | POA: Insufficient documentation

## 2019-08-18 DIAGNOSIS — Z86711 Personal history of pulmonary embolism: Secondary | ICD-10-CM | POA: Diagnosis not present

## 2019-08-18 DIAGNOSIS — Z86718 Personal history of other venous thrombosis and embolism: Secondary | ICD-10-CM | POA: Insufficient documentation

## 2019-08-18 DIAGNOSIS — J45909 Unspecified asthma, uncomplicated: Secondary | ICD-10-CM | POA: Diagnosis not present

## 2019-08-18 DIAGNOSIS — Z7901 Long term (current) use of anticoagulants: Secondary | ICD-10-CM | POA: Diagnosis not present

## 2019-08-18 DIAGNOSIS — N183 Chronic kidney disease, stage 3 (moderate): Secondary | ICD-10-CM | POA: Diagnosis not present

## 2019-08-18 DIAGNOSIS — E039 Hypothyroidism, unspecified: Secondary | ICD-10-CM | POA: Diagnosis not present

## 2019-08-18 DIAGNOSIS — Z7982 Long term (current) use of aspirin: Secondary | ICD-10-CM | POA: Diagnosis not present

## 2019-08-18 DIAGNOSIS — Z8719 Personal history of other diseases of the digestive system: Secondary | ICD-10-CM | POA: Diagnosis not present

## 2019-08-18 DIAGNOSIS — Z79899 Other long term (current) drug therapy: Secondary | ICD-10-CM | POA: Insufficient documentation

## 2019-08-18 HISTORY — PX: COLONOSCOPY WITH PROPOFOL: SHX5780

## 2019-08-18 HISTORY — DX: Sleep apnea, unspecified: G47.30

## 2019-08-18 HISTORY — DX: Chronic kidney disease, unspecified: N18.9

## 2019-08-18 HISTORY — DX: Heart failure, unspecified: I50.9

## 2019-08-18 HISTORY — DX: Headache, unspecified: R51.9

## 2019-08-18 SURGERY — COLONOSCOPY WITH PROPOFOL
Anesthesia: General

## 2019-08-18 MED ORDER — PROPOFOL 500 MG/50ML IV EMUL
INTRAVENOUS | Status: AC
  Filled 2019-08-18: qty 50

## 2019-08-18 MED ORDER — SODIUM CHLORIDE 0.9 % IV SOLN
INTRAVENOUS | Status: DC
  Administered 2019-08-18: 1000 mL via INTRAVENOUS

## 2019-08-18 MED ORDER — PROPOFOL 10 MG/ML IV BOLUS
INTRAVENOUS | Status: DC | PRN
  Administered 2019-08-18: 40 mg via INTRAVENOUS

## 2019-08-18 MED ORDER — PROPOFOL 500 MG/50ML IV EMUL
INTRAVENOUS | Status: DC | PRN
  Administered 2019-08-18: 180 ug/kg/min via INTRAVENOUS

## 2019-08-18 NOTE — Anesthesia Postprocedure Evaluation (Signed)
Anesthesia Post Note  Patient: Nancy Moyer  Procedure(s) Performed: COLONOSCOPY WITH PROPOFOL (N/A )  Patient location during evaluation: Endoscopy Anesthesia Type: General Level of consciousness: awake and alert Pain management: pain level controlled Vital Signs Assessment: post-procedure vital signs reviewed and stable Respiratory status: spontaneous breathing and respiratory function stable Cardiovascular status: stable Anesthetic complications: no     Last Vitals:  Vitals:   08/18/19 1313 08/18/19 1431  BP:  117/60  Pulse:  84  Resp: 18 18  Temp: 37 C (!) 36.1 C  SpO2:  97%    Last Pain:  Vitals:   08/18/19 1431  TempSrc: Tympanic  PainSc: 0-No pain                 Geraldina Parrott K

## 2019-08-18 NOTE — Anesthesia Preprocedure Evaluation (Signed)
Anesthesia Evaluation  Patient identified by MRN, date of birth, ID band Patient awake    Reviewed: Allergy & Precautions, NPO status , Patient's Chart, lab work & pertinent test results  History of Anesthesia Complications Negative for: history of anesthetic complications  Airway Mallampati: III       Dental   Pulmonary asthma , sleep apnea and Continuous Positive Airway Pressure Ventilation , COPD,  COPD inhaler, Not current smoker,           Cardiovascular hypertension, Pt. on medications and Pt. on home beta blockers (-) Past MI and (-) CHF (-) dysrhythmias (-) Valvular Problems/Murmurs  S/P PE   Neuro/Psych neg Seizures    GI/Hepatic Neg liver ROS, neg GERD  ,  Endo/Other  neg diabetesHypothyroidism   Renal/GU Renal InsufficiencyRenal disease     Musculoskeletal   Abdominal   Peds  Hematology   Anesthesia Other Findings   Reproductive/Obstetrics                             Anesthesia Physical Anesthesia Plan  ASA: III  Anesthesia Plan: General   Post-op Pain Management:    Induction: Intravenous  PONV Risk Score and Plan: 3 and TIVA, Propofol infusion and Treatment may vary due to age or medical condition  Airway Management Planned: Nasal Cannula  Additional Equipment:   Intra-op Plan:   Post-operative Plan:   Informed Consent: I have reviewed the patients History and Physical, chart, labs and discussed the procedure including the risks, benefits and alternatives for the proposed anesthesia with the patient or authorized representative who has indicated his/her understanding and acceptance.       Plan Discussed with:   Anesthesia Plan Comments:         Anesthesia Quick Evaluation

## 2019-08-18 NOTE — Transfer of Care (Signed)
Immediate Anesthesia Transfer of Care Note  Patient: Nancy Moyer  Procedure(s) Performed: COLONOSCOPY WITH PROPOFOL (N/A )  Patient Location: PACU  Anesthesia Type:General  Level of Consciousness: sedated  Airway & Oxygen Therapy: Patient Spontanous Breathing and Patient connected to nasal cannula oxygen  Post-op Assessment: Report given to RN and Post -op Vital signs reviewed and stable  Post vital signs: Reviewed and stable  Last Vitals:  Vitals Value Taken Time  BP 117/60 08/18/19 1431  Temp 36.1 C 08/18/19 1431  Pulse 86 08/18/19 1434  Resp 18 08/18/19 1434  SpO2 97 % 08/18/19 1434  Vitals shown include unvalidated device data.  Last Pain:  Vitals:   08/18/19 1431  TempSrc: Tympanic  PainSc: 0-No pain      Patients Stated Pain Goal: 0 ([id number])  Complications: No apparent anesthesia complications

## 2019-08-18 NOTE — H&P (Addendum)
Outpatient short stay form Pre-procedure 08/18/2019 1:53 PM Nancy Sails MD  Primary Physician: Dr. Fulton Reek  Reason for visit: Colonoscopy  History of present illness: Patient is a 83 year old female presenting today for colonoscopy in regards to personal history of adenomatous colon polyps.  She denies any rectal bleeding abdominal pain or Ulyess Mort although she does have some intermittent left lower quadrant discomfort in the setting of a history of known diverticulosis.  No previous episodes of diverticulitis.  Takes a 325 mg aspirin daily that is been held for 5 days.  She takes no other aspirin product or blood thinning agent.    Current Facility-Administered Medications:  .  0.9 %  sodium chloride infusion, , Intravenous, Continuous, Nancy Sails, MD, Last Rate: 20 mL/hr at 08/18/19 1340, 1,000 mL at 08/18/19 1340  Medications Prior to Admission  Medication Sig Dispense Refill Last Dose  . albuterol (PROVENTIL HFA) 108 (90 Base) MCG/ACT inhaler Inhale 2 puffs into the lungs every 4 (four) hours as needed for wheezing or shortness of breath. 1 Inhaler 3 08/18/2019 at Unknown time  . alendronate (FOSAMAX) 70 MG tablet TAKE 1 TABLET EVERY WEEK AS DIRECTED  (STOP  BONIVA) 12 tablet 2 08/17/2019 at Unknown time  . aspirin 325 MG tablet Take 325 mg by mouth daily.   Past Week at Unknown time  . Fluticasone-Salmeterol (ADVAIR) 100-50 MCG/DOSE AEPB Inhale 1 puff into the lungs 2 (two) times daily.   08/18/2019 at Unknown time  . furosemide (LASIX) 20 MG tablet Take 2 tablets (40 mg total) by mouth daily. 60 tablet 3 Past Week at Unknown time  . levothyroxine (SYNTHROID, LEVOTHROID) 100 MCG tablet Take 1 tablet (100 mcg total) by mouth daily before breakfast. 90 tablet 1 08/17/2019 at Unknown time  . lisinopril (PRINIVIL,ZESTRIL) 20 MG tablet Take 1 tablet (20 mg total) by mouth daily. 90 tablet 3 08/17/2019 at Unknown time  . montelukast (SINGULAIR) 10 MG tablet TAKE 1 TABLET BY MOUTH   DAILY 90 tablet 1 08/17/2019 at Unknown time  . potassium chloride (K-DUR,KLOR-CON) 10 MEQ tablet Take 1 tablet (10 mEq total) by mouth daily. 90 tablet 1 08/17/2019 at Unknown time  . Calcium Carbonate-Vitamin D 600-400 MG-UNIT tablet Take by mouth.     . carvedilol (COREG) 12.5 MG tablet Take 1 tablet (12.5 mg total) by mouth 2 (two) times daily with a meal. (Patient not taking: Reported on 08/18/2019) 180 tablet 3 Not Taking at Unknown time  . doxycycline (VIBRA-TABS) 100 MG tablet Take 1 tablet (100 mg total) by mouth 2 (two) times daily. 28 tablet 0   . ELIQUIS 5 MG TABS tablet Take 1 tablet (5 mg total) by mouth 2 (two) times daily. (Patient not taking: Reported on 08/18/2019) 60 tablet 3 Completed Course at Unknown time  . lovastatin (MEVACOR) 10 MG tablet TAKE 1 TABLET AT BEDTIME FOR CHOLESTEROL (Patient taking differently: 10 mg. TAKE 1 TABLET AT BEDTIME FOR CHOLESTEROL) 90 tablet 2   . meloxicam (MOBIC) 15 MG tablet Take 1 tablet (15 mg total) by mouth daily. (Patient not taking: Reported on 08/18/2019) 90 tablet 2 Not Taking at Unknown time  . mometasone (NASONEX) 50 MCG/ACT nasal spray Place 2 sprays into the nose daily. (Patient not taking: Reported on 08/18/2019) 17 g 1 Not Taking at Unknown time  . Multiple Vitamin (MULTI-VITAMINS) TABS Take by mouth.     . nitrofurantoin, macrocrystal-monohydrate, (MACROBID) 100 MG capsule Take 1 cap po twice a daily for Uti 20 capsule 0   .  omeprazole (PRILOSEC) 20 MG capsule Take 1 capsule (20 mg total) by mouth daily. 90 capsule 2   . triamcinolone (KENALOG) 0.025 % cream         Allergies  Allergen Reactions  . Codeine   . Diphenhydramine Hcl Swelling    Other reaction(s): Unknown   . Mobic [Meloxicam]   . Shellfish Allergy   . Iodine Rash    Other reaction(s): Unknown   . Penicillins Rash  . Sulfa Antibiotics Rash and Other (See Comments)     Past Medical History:  Diagnosis Date  . CHF (congestive heart failure) ([place])   . Chronic  kidney disease    stage 3  . Deep venous thrombosis ([place])   . Headache   . Hyperlipidemia   . Hypertension   . Pulmonary embolism ([place])   . Sleep apnea   . Thyroid disease     Review of systems:      Physical Exam    Heart and lungs: Regular rate and rhythm without rub or gallop lungs are bilaterally clear    HEENT: Normocephalic atraumatic eyes are anicteric    Other:    Pertinant exam for procedure: Soft nontender nondistended bowel sounds positive normoactive    Planned proceedures: Colonoscopy and indicated procedures. I have discussed the risks benefits and complications of procedures to include not limited to bleeding, infection, perforation and the risk of sedation and the patient wishes to proceed.    Nancy Sails, MD Gastroenterology 08/18/2019  1:53 PM

## 2019-08-18 NOTE — Op Note (Signed)
[facility] Gastroenterology Patient Name: Nancy Moyer Procedure Date: 08/18/2019 1:44 PM MRN: [id number] Account #: [id number] Date of Birth: May 16, 1936 Admit Type: Outpatient Age: 83 Room: [facility] ENDO ROOM 1 Gender: Female Note Status: Finalized Procedure:            Colonoscopy Indications:          Abdominal pain in the left lower quadrant, Personal                        history of colonic polyps Providers:            Lollie Sails, MD Referring MD:         Leonie Douglas. Doy Hutching, MD (Referring MD) Medicines:            Monitored Anesthesia Care Complications:        No immediate complications. Procedure:            Pre-Anesthesia Assessment:                       - ASA Grade Assessment: III - A patient with severe                        systemic disease.                       After obtaining informed consent, the colonoscope was                        passed under direct vision. Throughout the procedure,                        the patient's blood pressure, pulse, and oxygen                        saturations were monitored continuously. The was                        introduced through the anus and advanced to the the                        cecum, identified by appendiceal orifice and ileocecal                        valve. The colonoscopy was performed without                        difficulty. The patient tolerated the procedure well.                        The quality of the bowel preparation was good. Findings:      Multiple small to medium-mouthed diverticula were found in the sigmoid       colon and descending colon.      The retroflexed view of the distal rectum and anal verge was normal and       showed no anal or rectal abnormalities.      The exam was otherwise without abnormality. Impression:           - Diverticulosis in the sigmoid colon and in the  descending colon.                       - The distal rectum and anal  verge are normal on                        retroflexion view.                       - The examination was otherwise normal.                       - No specimens collected. Recommendation:       - Use Citrucel one tablespoon PO daily daily. Procedure Code(s):    --- Professional ---                       [phone], Colonoscopy, flexible; diagnostic, including                        collection of specimen(s) by brushing or washing, when                        performed (separate procedure) Diagnosis Code(s):    --- Professional ---                       R10.32, Left lower quadrant pain                       Z86.010, Personal history of colonic polyps                       K57.30, Diverticulosis of large intestine without                        perforation or abscess without bleeding CPT copyright 2019 American Medical Association. All rights reserved. The codes documented in this report are preliminary and upon coder review may  be revised to meet current compliance requirements. Lollie Sails, MD 08/18/2019 2:28:05 PM This report has been signed electronically. Number of Addenda: 0 Note Initiated On: 08/18/2019 1:44 PM Scope Withdrawal Time: 0 hours 7 minutes 52 seconds  Total Procedure Duration: 0 hours 21 minutes 2 seconds       [facility]

## 2019-08-18 NOTE — Anesthesia Post-op Follow-up Note (Signed)
Anesthesia QCDR form completed.        

## 2019-08-19 ENCOUNTER — Encounter: Payer: Self-pay | Admitting: Gastroenterology

## 2019-09-16 ENCOUNTER — Ambulatory Visit: Payer: Medicare Other | Admitting: Internal Medicine

## 2019-09-18 ENCOUNTER — Other Ambulatory Visit: Payer: Self-pay | Admitting: Adult Health

## 2019-09-18 DIAGNOSIS — K219 Gastro-esophageal reflux disease without esophagitis: Secondary | ICD-10-CM

## 2019-09-18 DIAGNOSIS — E785 Hyperlipidemia, unspecified: Secondary | ICD-10-CM

## 2019-10-04 ENCOUNTER — Other Ambulatory Visit: Payer: Self-pay | Admitting: Adult Health

## 2019-10-04 DIAGNOSIS — E785 Hyperlipidemia, unspecified: Secondary | ICD-10-CM

## 2019-10-04 DIAGNOSIS — K219 Gastro-esophageal reflux disease without esophagitis: Secondary | ICD-10-CM

## 2019-10-16 ENCOUNTER — Other Ambulatory Visit: Payer: Self-pay | Admitting: Adult Health

## 2019-10-16 DIAGNOSIS — R609 Edema, unspecified: Secondary | ICD-10-CM

## 2019-10-20 ENCOUNTER — Other Ambulatory Visit: Payer: Self-pay | Admitting: Adult Health

## 2019-10-20 DIAGNOSIS — E039 Hypothyroidism, unspecified: Secondary | ICD-10-CM

## 2019-12-04 ENCOUNTER — Other Ambulatory Visit: Payer: Self-pay | Admitting: Adult Health

## 2019-12-08 DIAGNOSIS — M216X2 Other acquired deformities of left foot: Secondary | ICD-10-CM | POA: Diagnosis not present

## 2019-12-08 DIAGNOSIS — D2372 Other benign neoplasm of skin of left lower limb, including hip: Secondary | ICD-10-CM | POA: Diagnosis not present

## 2019-12-08 DIAGNOSIS — M79672 Pain in left foot: Secondary | ICD-10-CM | POA: Diagnosis not present

## 2019-12-08 DIAGNOSIS — M19012 Primary osteoarthritis, left shoulder: Secondary | ICD-10-CM | POA: Diagnosis not present

## 2019-12-09 ENCOUNTER — Other Ambulatory Visit: Payer: Self-pay | Admitting: Adult Health

## 2019-12-09 DIAGNOSIS — E785 Hyperlipidemia, unspecified: Secondary | ICD-10-CM

## 2019-12-09 DIAGNOSIS — J309 Allergic rhinitis, unspecified: Secondary | ICD-10-CM

## 2019-12-09 DIAGNOSIS — E039 Hypothyroidism, unspecified: Secondary | ICD-10-CM

## 2019-12-09 DIAGNOSIS — I10 Essential (primary) hypertension: Secondary | ICD-10-CM

## 2019-12-10 NOTE — Telephone Encounter (Signed)
Is she still our pt

## 2019-12-12 DIAGNOSIS — J4540 Moderate persistent asthma, uncomplicated: Secondary | ICD-10-CM | POA: Diagnosis not present

## 2019-12-25 ENCOUNTER — Other Ambulatory Visit: Payer: Self-pay | Admitting: Adult Health

## 2019-12-25 DIAGNOSIS — E785 Hyperlipidemia, unspecified: Secondary | ICD-10-CM

## 2019-12-25 DIAGNOSIS — J309 Allergic rhinitis, unspecified: Secondary | ICD-10-CM

## 2019-12-25 DIAGNOSIS — I10 Essential (primary) hypertension: Secondary | ICD-10-CM

## 2019-12-25 DIAGNOSIS — E039 Hypothyroidism, unspecified: Secondary | ICD-10-CM

## 2019-12-30 DIAGNOSIS — J4540 Moderate persistent asthma, uncomplicated: Secondary | ICD-10-CM | POA: Diagnosis not present

## 2020-01-05 DIAGNOSIS — Z01818 Encounter for other preprocedural examination: Secondary | ICD-10-CM | POA: Diagnosis not present

## 2020-01-07 DIAGNOSIS — R0600 Dyspnea, unspecified: Secondary | ICD-10-CM | POA: Diagnosis not present

## 2020-01-21 DIAGNOSIS — H2513 Age-related nuclear cataract, bilateral: Secondary | ICD-10-CM | POA: Diagnosis not present

## 2020-01-23 DIAGNOSIS — G4733 Obstructive sleep apnea (adult) (pediatric): Secondary | ICD-10-CM | POA: Diagnosis not present

## 2020-01-26 DIAGNOSIS — J4540 Moderate persistent asthma, uncomplicated: Secondary | ICD-10-CM | POA: Diagnosis not present

## 2020-01-27 DIAGNOSIS — I10 Essential (primary) hypertension: Secondary | ICD-10-CM | POA: Diagnosis not present

## 2020-01-27 DIAGNOSIS — E039 Hypothyroidism, unspecified: Secondary | ICD-10-CM | POA: Diagnosis not present

## 2020-01-27 DIAGNOSIS — Z79899 Other long term (current) drug therapy: Secondary | ICD-10-CM | POA: Diagnosis not present

## 2020-01-27 DIAGNOSIS — R7309 Other abnormal glucose: Secondary | ICD-10-CM | POA: Diagnosis not present

## 2020-01-29 DIAGNOSIS — J4540 Moderate persistent asthma, uncomplicated: Secondary | ICD-10-CM | POA: Diagnosis not present

## 2020-01-30 DIAGNOSIS — J4540 Moderate persistent asthma, uncomplicated: Secondary | ICD-10-CM | POA: Diagnosis not present

## 2020-02-02 DIAGNOSIS — G4733 Obstructive sleep apnea (adult) (pediatric): Secondary | ICD-10-CM | POA: Diagnosis not present

## 2020-02-20 DIAGNOSIS — G4733 Obstructive sleep apnea (adult) (pediatric): Secondary | ICD-10-CM | POA: Diagnosis not present

## 2020-02-24 DIAGNOSIS — I10 Essential (primary) hypertension: Secondary | ICD-10-CM | POA: Diagnosis not present

## 2020-02-24 DIAGNOSIS — E039 Hypothyroidism, unspecified: Secondary | ICD-10-CM | POA: Diagnosis not present

## 2020-02-24 DIAGNOSIS — R7309 Other abnormal glucose: Secondary | ICD-10-CM | POA: Diagnosis not present

## 2020-02-24 DIAGNOSIS — Z79899 Other long term (current) drug therapy: Secondary | ICD-10-CM | POA: Diagnosis not present

## 2020-02-27 DIAGNOSIS — J4540 Moderate persistent asthma, uncomplicated: Secondary | ICD-10-CM | POA: Diagnosis not present

## 2020-03-02 DIAGNOSIS — I119 Hypertensive heart disease without heart failure: Secondary | ICD-10-CM | POA: Diagnosis not present

## 2020-03-02 DIAGNOSIS — E7800 Pure hypercholesterolemia, unspecified: Secondary | ICD-10-CM | POA: Diagnosis not present

## 2020-03-02 DIAGNOSIS — Z79899 Other long term (current) drug therapy: Secondary | ICD-10-CM | POA: Diagnosis not present

## 2020-03-02 DIAGNOSIS — R739 Hyperglycemia, unspecified: Secondary | ICD-10-CM | POA: Diagnosis not present

## 2020-03-02 DIAGNOSIS — E039 Hypothyroidism, unspecified: Secondary | ICD-10-CM | POA: Diagnosis not present

## 2020-03-02 DIAGNOSIS — R8290 Unspecified abnormal findings in urine: Secondary | ICD-10-CM | POA: Diagnosis not present

## 2020-03-02 DIAGNOSIS — R600 Localized edema: Secondary | ICD-10-CM | POA: Diagnosis not present

## 2020-03-02 DIAGNOSIS — Z79890 Hormone replacement therapy (postmenopausal): Secondary | ICD-10-CM | POA: Diagnosis not present

## 2020-03-02 DIAGNOSIS — R109 Unspecified abdominal pain: Secondary | ICD-10-CM | POA: Diagnosis not present

## 2020-03-02 DIAGNOSIS — I10 Essential (primary) hypertension: Secondary | ICD-10-CM | POA: Diagnosis not present

## 2020-03-03 ENCOUNTER — Other Ambulatory Visit: Payer: Self-pay | Admitting: Internal Medicine

## 2020-03-03 DIAGNOSIS — R109 Unspecified abdominal pain: Secondary | ICD-10-CM

## 2020-03-04 DIAGNOSIS — Z789 Other specified health status: Secondary | ICD-10-CM | POA: Diagnosis not present

## 2020-03-04 DIAGNOSIS — I10 Essential (primary) hypertension: Secondary | ICD-10-CM | POA: Diagnosis not present

## 2020-03-09 DIAGNOSIS — H2511 Age-related nuclear cataract, right eye: Secondary | ICD-10-CM | POA: Diagnosis not present

## 2020-03-10 ENCOUNTER — Ambulatory Visit
Admission: RE | Admit: 2020-03-10 | Discharge: 2020-03-10 | Disposition: A | Discharge status: Home / Self Care | Payer: Medicare HMO | Source: Ambulatory Visit | Attending: Internal Medicine | Admitting: Internal Medicine

## 2020-03-10 ENCOUNTER — Other Ambulatory Visit: Payer: Self-pay

## 2020-03-10 DIAGNOSIS — R109 Unspecified abdominal pain: Secondary | ICD-10-CM | POA: Diagnosis not present

## 2020-03-17 ENCOUNTER — Other Ambulatory Visit: Payer: Self-pay

## 2020-03-17 ENCOUNTER — Encounter: Payer: Self-pay | Admitting: Ophthalmology

## 2020-03-18 ENCOUNTER — Other Ambulatory Visit
Admission: RE | Admit: 2020-03-18 | Discharge: 2020-03-18 | Disposition: A | Discharge status: Home / Self Care | Payer: Medicare HMO | Source: Ambulatory Visit | Attending: Ophthalmology | Admitting: Ophthalmology

## 2020-03-18 DIAGNOSIS — Z20822 Contact with and (suspected) exposure to covid-19: Secondary | ICD-10-CM | POA: Insufficient documentation

## 2020-03-18 DIAGNOSIS — Z01812 Encounter for preprocedural laboratory examination: Secondary | ICD-10-CM | POA: Diagnosis not present

## 2020-03-18 LAB — SARS CORONAVIRUS 2 (TAT 6-24 HRS): SARS Coronavirus 2: NEGATIVE

## 2020-03-18 NOTE — Discharge Instructions (Signed)

## 2020-03-22 ENCOUNTER — Ambulatory Visit: Payer: Medicare HMO | Admitting: Anesthesiology

## 2020-03-22 ENCOUNTER — Other Ambulatory Visit: Payer: Self-pay

## 2020-03-22 ENCOUNTER — Encounter
Admission: RE | Disposition: A | Discharge status: Home / Self Care | Payer: Self-pay | Source: Home / Self Care | Attending: Ophthalmology

## 2020-03-22 ENCOUNTER — Ambulatory Visit
Admission: RE | Admit: 2020-03-22 | Discharge: 2020-03-22 | Disposition: A | Discharge status: Home / Self Care | Payer: Medicare HMO | Attending: Ophthalmology | Admitting: Ophthalmology

## 2020-03-22 ENCOUNTER — Encounter: Payer: Self-pay | Admitting: Ophthalmology

## 2020-03-22 DIAGNOSIS — E669 Obesity, unspecified: Secondary | ICD-10-CM | POA: Diagnosis not present

## 2020-03-22 DIAGNOSIS — Z79899 Other long term (current) drug therapy: Secondary | ICD-10-CM | POA: Diagnosis not present

## 2020-03-22 DIAGNOSIS — Z888 Allergy status to other drugs, medicaments and biological substances status: Secondary | ICD-10-CM | POA: Diagnosis not present

## 2020-03-22 DIAGNOSIS — H2511 Age-related nuclear cataract, right eye: Secondary | ICD-10-CM | POA: Insufficient documentation

## 2020-03-22 DIAGNOSIS — E7800 Pure hypercholesterolemia, unspecified: Secondary | ICD-10-CM | POA: Insufficient documentation

## 2020-03-22 DIAGNOSIS — G4730 Sleep apnea, unspecified: Secondary | ICD-10-CM | POA: Insufficient documentation

## 2020-03-22 DIAGNOSIS — Z86711 Personal history of pulmonary embolism: Secondary | ICD-10-CM | POA: Diagnosis not present

## 2020-03-22 DIAGNOSIS — I10 Essential (primary) hypertension: Secondary | ICD-10-CM | POA: Insufficient documentation

## 2020-03-22 DIAGNOSIS — Z7983 Long term (current) use of bisphosphonates: Secondary | ICD-10-CM | POA: Insufficient documentation

## 2020-03-22 DIAGNOSIS — M810 Age-related osteoporosis without current pathological fracture: Secondary | ICD-10-CM | POA: Diagnosis not present

## 2020-03-22 DIAGNOSIS — M1990 Unspecified osteoarthritis, unspecified site: Secondary | ICD-10-CM | POA: Diagnosis not present

## 2020-03-22 DIAGNOSIS — Z880 Allergy status to penicillin: Secondary | ICD-10-CM | POA: Insufficient documentation

## 2020-03-22 DIAGNOSIS — Z7982 Long term (current) use of aspirin: Secondary | ICD-10-CM | POA: Insufficient documentation

## 2020-03-22 DIAGNOSIS — Z885 Allergy status to narcotic agent status: Secondary | ICD-10-CM | POA: Insufficient documentation

## 2020-03-22 DIAGNOSIS — Z6832 Body mass index (BMI) 32.0-32.9, adult: Secondary | ICD-10-CM | POA: Diagnosis not present

## 2020-03-22 DIAGNOSIS — E039 Hypothyroidism, unspecified: Secondary | ICD-10-CM | POA: Diagnosis not present

## 2020-03-22 DIAGNOSIS — J4540 Moderate persistent asthma, uncomplicated: Secondary | ICD-10-CM | POA: Diagnosis not present

## 2020-03-22 DIAGNOSIS — G4733 Obstructive sleep apnea (adult) (pediatric): Secondary | ICD-10-CM | POA: Diagnosis not present

## 2020-03-22 DIAGNOSIS — J449 Chronic obstructive pulmonary disease, unspecified: Secondary | ICD-10-CM | POA: Diagnosis not present

## 2020-03-22 DIAGNOSIS — H25811 Combined forms of age-related cataract, right eye: Secondary | ICD-10-CM | POA: Diagnosis not present

## 2020-03-22 DIAGNOSIS — Z882 Allergy status to sulfonamides status: Secondary | ICD-10-CM | POA: Diagnosis not present

## 2020-03-22 DIAGNOSIS — Z79890 Hormone replacement therapy (postmenopausal): Secondary | ICD-10-CM | POA: Insufficient documentation

## 2020-03-22 HISTORY — DX: Unspecified osteoarthritis, unspecified site: M19.90

## 2020-03-22 HISTORY — DX: Unspecified asthma, uncomplicated: J45.909

## 2020-03-22 HISTORY — DX: Presence of external hearing-aid: Z97.4

## 2020-03-22 HISTORY — PX: CATARACT EXTRACTION W/PHACO: SHX586

## 2020-03-22 SURGERY — PHACOEMULSIFICATION, CATARACT, WITH IOL INSERTION
Anesthesia: Monitor Anesthesia Care | Site: Eye | Laterality: Right

## 2020-03-22 MED ORDER — LIDOCAINE HCL (PF) 2 % IJ SOLN
INTRAOCULAR | Status: DC | PRN
  Administered 2020-03-22: 1 mL via INTRAOCULAR

## 2020-03-22 MED ORDER — ACETAMINOPHEN 160 MG/5ML PO SOLN: 325.0000 mg | ORAL | Status: DC | PRN

## 2020-03-22 MED ORDER — SODIUM HYALURONATE 23 MG/ML IO SOLN
INTRAOCULAR | Status: DC | PRN
  Administered 2020-03-22: 0.6 mL via INTRAOCULAR

## 2020-03-22 MED ORDER — ONDANSETRON HCL 4 MG/2ML IJ SOLN: 4.0000 mg | Freq: Once | INTRAMUSCULAR | Status: DC | PRN

## 2020-03-22 MED ORDER — MIDAZOLAM HCL 2 MG/2ML IJ SOLN
INTRAMUSCULAR | Status: DC | PRN
  Administered 2020-03-22: 2 mg via INTRAVENOUS

## 2020-03-22 MED ORDER — MOXIFLOXACIN HCL 0.5 % OP SOLN
OPHTHALMIC | Status: DC | PRN
  Administered 2020-03-22: 0.2 mL via OPHTHALMIC

## 2020-03-22 MED ORDER — SODIUM HYALURONATE 10 MG/ML IO SOLN
INTRAOCULAR | Status: DC | PRN
  Administered 2020-03-22: 0.55 mL via INTRAOCULAR

## 2020-03-22 MED ORDER — FENTANYL CITRATE (PF) 100 MCG/2ML IJ SOLN
INTRAMUSCULAR | Status: DC | PRN
  Administered 2020-03-22: 50 ug via INTRAVENOUS

## 2020-03-22 MED ORDER — ACETAMINOPHEN 325 MG PO TABS: 325.0000 mg | ORAL_TABLET | ORAL | Status: DC | PRN

## 2020-03-22 MED ORDER — TETRACAINE HCL 0.5 % OP SOLN
1.0000 [drp] | OPHTHALMIC | Status: DC | PRN
  Administered 2020-03-22 (×3): 1 [drp] via OPHTHALMIC

## 2020-03-22 MED ORDER — EPINEPHRINE PF 1 MG/ML IJ SOLN
INTRAOCULAR | Status: DC | PRN
  Administered 2020-03-22: 73 mL via OPHTHALMIC

## 2020-03-22 MED ORDER — ARMC OPHTHALMIC DILATING DROPS
1.0000 "application " | OPHTHALMIC | Status: DC | PRN
  Administered 2020-03-22 (×3): 1 via OPHTHALMIC

## 2020-03-22 SURGICAL SUPPLY — 19 items
CANNULA ANT/CHMB 27G (MISCELLANEOUS) ×2 IMPLANT
CANNULA ANT/CHMB 27GA (MISCELLANEOUS) ×6 IMPLANT
DISSECTOR HYDRO NUCLEUS 50X22 (MISCELLANEOUS) ×3 IMPLANT
GLOVE SURG LX 7.5 STRW (GLOVE) ×4
GLOVE SURG LX STRL 7.5 STRW (GLOVE) ×1 IMPLANT
GLOVE SURG SYN 8.5  E (GLOVE) ×2
GLOVE SURG SYN 8.5 E (GLOVE) ×1 IMPLANT
GLOVE SURG SYN 8.5 PF PI (GLOVE) ×1 IMPLANT
GOWN STRL REUS W/ TWL LRG LVL3 (GOWN DISPOSABLE) ×2 IMPLANT
GOWN STRL REUS W/TWL LRG LVL3 (GOWN DISPOSABLE) ×4
LENS IOL TECNIS ITEC 25.5 (Intraocular Lens) ×2 IMPLANT
MARKER SKIN DUAL TIP RULER LAB (MISCELLANEOUS) ×3 IMPLANT
PACK DR. KING ARMS (PACKS) ×3 IMPLANT
PACK EYE AFTER SURG (MISCELLANEOUS) ×3 IMPLANT
PACK OPTHALMIC (MISCELLANEOUS) ×3 IMPLANT
SYR 3ML LL SCALE MARK (SYRINGE) ×3 IMPLANT
SYR TB 1ML LUER SLIP (SYRINGE) ×3 IMPLANT
WATER STERILE IRR 250ML POUR (IV SOLUTION) ×3 IMPLANT
WIPE NON LINTING 3.25X3.25 (MISCELLANEOUS) ×3 IMPLANT

## 2020-03-22 NOTE — Anesthesia Postprocedure Evaluation (Signed)
Anesthesia Post Note  Patient: Nancy Moyer  Procedure(s) Performed: CATARACT EXTRACTION PHACO AND INTRAOCULAR LENS PLACEMENT (IOC) RIGHT 3.29  00:33.9 (Right Eye)     Patient location during evaluation: PACU Anesthesia Type: MAC Level of consciousness: awake and alert Pain management: pain level controlled Vital Signs Assessment: post-procedure vital signs reviewed and stable Respiratory status: spontaneous breathing Cardiovascular status: blood pressure returned to baseline Postop Assessment: no apparent nausea or vomiting, adequate PO intake and no headache Anesthetic complications: no    Adele Barthel [place]ett [place]

## 2020-03-22 NOTE — Anesthesia Preprocedure Evaluation (Signed)
Anesthesia Evaluation  Patient identified by MRN, date of birth, ID band Patient awake    History of Anesthesia Complications Negative for: history of anesthetic complications  Airway Mallampati: IV      Comment: Small mouth opening Dental  (+)    Pulmonary asthma , sleep apnea ,    Pulmonary exam normal        Cardiovascular hypertension, Normal cardiovascular exam     Neuro/Psych negative neurological ROS     GI/Hepatic negative GI ROS, Neg liver ROS,   Endo/Other  Hypothyroidism Obesity BMI 32  Renal/GU negative Renal ROS     Musculoskeletal   Abdominal   Peds  Hematology Hx of DVTs, takes Eliquis, reports that she is currently off for upcoming oral surgery   Anesthesia Other Findings   Reproductive/Obstetrics                             Anesthesia Physical Anesthesia Plan  ASA: III  Anesthesia Plan: MAC   Post-op Pain Management:    Induction: Intravenous  PONV Risk Score and Plan: 2 and Midazolam and TIVA  Airway Management Planned: Nasal Cannula and Natural Airway  Additional Equipment: None  Intra-op Plan:   Post-operative Plan:   Informed Consent: I have reviewed the patients History and Physical, chart, labs and discussed the procedure including the risks, benefits and alternatives for the proposed anesthesia with the patient or authorized representative who has indicated his/her understanding and acceptance.       Plan Discussed with: CRNA  Anesthesia Plan Comments:         Anesthesia Quick Evaluation

## 2020-03-22 NOTE — Anesthesia Procedure Notes (Signed)
Procedure Name: MAC Date/Time: 03/22/2020 10:13 AM Performed by: Jeannene Patella, CRNA Pre-anesthesia Checklist: Patient identified, Emergency Drugs available, Suction available, Patient being monitored and Timeout performed Patient Re-evaluated:Patient Re-evaluated prior to induction Oxygen Delivery Method: Nasal cannula

## 2020-03-22 NOTE — Op Note (Signed)
OPERATIVE NOTE  Nancy Moyer [id number] 03/22/2020   PREOPERATIVE DIAGNOSIS:  Nuclear sclerotic cataract right eye.  H25.11   POSTOPERATIVE DIAGNOSIS:    Nuclear sclerotic cataract right eye.     PROCEDURE:  Phacoemusification with posterior chamber intraocular lens placement of the right eye   LENS:   Implant Name Type Inv. Item Serial No. Manufacturer Lot No. LRB No. Used Action  LENS IOL DIOP 25.5 - [id number] Intraocular Lens LENS IOL DIOP 25.5 [id number]  Right 1 Implanted       Procedure(s) with comments: CATARACT EXTRACTION PHACO AND INTRAOCULAR LENS PLACEMENT (IOC) RIGHT 3.29  00:33.9 (Right) - sleep apnea  PCB00 +25.5   ULTRASOUND TIME: 0 minutes 33 seconds.  CDE 3.29   SURGEON:  Benay Pillow, MD, MPH  ANESTHESIOLOGIST: Anesthesiologist: Page, Adele Barthel, MD CRNA: Jeannene Patella, CRNA   ANESTHESIA:  Topical with tetracaine drops augmented with 1% preservative-free intracameral lidocaine.  ESTIMATED BLOOD LOSS: less than 1 mL.   COMPLICATIONS:  None.   DESCRIPTION OF PROCEDURE:  The patient was identified in the holding room and transported to the operating room and placed in the supine position under the operating microscope.  The right eye was identified as the operative eye and it was prepped and draped in the usual sterile ophthalmic fashion.   A 1.0 millimeter clear-corneal paracentesis was made at the 10:30 position. 0.5 ml of preservative-free 1% lidocaine with epinephrine was injected into the anterior chamber.  The anterior chamber was filled with Healon 5 viscoelastic.  A 2.4 millimeter keratome was used to make a near-clear corneal incision at the 8:00 position.  A curvilinear capsulorrhexis was made with a cystotome and capsulorrhexis forceps.  Balanced salt solution was used to hydrodissect and hydrodelineate the nucleus.   Phacoemulsification was then used in stop and chop fashion to remove the lens nucleus and epinucleus.  The remaining cortex  was then removed using the irrigation and aspiration handpiece. Healon was then placed into the capsular bag to distend it for lens placement.  A lens was then injected into the capsular bag.  The remaining viscoelastic was aspirated.   Wounds were hydrated with balanced salt solution.  The anterior chamber was inflated to a physiologic pressure with balanced salt solution.   Intracameral vigamox 0.1 mL undiluted was injected into the eye and a drop placed onto the ocular surface.  No wound leaks were noted.  The patient was taken to the recovery room in stable condition without complications of anesthesia or surgery  Benay Pillow 03/22/2020, 10:37 AM

## 2020-03-22 NOTE — Transfer of Care (Signed)
Immediate Anesthesia Transfer of Care Note  Patient: Nancy Moyer  Procedure(s) Performed: CATARACT EXTRACTION PHACO AND INTRAOCULAR LENS PLACEMENT (IOC) RIGHT 3.29  00:33.9 (Right Eye)  Patient Location: PACU  Anesthesia Type: MAC  Level of Consciousness: awake, alert  and patient cooperative  Airway and Oxygen Therapy: Patient Spontanous Breathing and Patient connected to supplemental oxygen  Post-op Assessment: Post-op Vital signs reviewed, Patient's Cardiovascular Status Stable, Respiratory Function Stable, Patent Airway and No signs of Nausea or vomiting  Post-op Vital Signs: Reviewed and stable  Complications: No apparent anesthesia complications

## 2020-03-22 NOTE — H&P (Signed)

## 2020-03-23 ENCOUNTER — Encounter: Payer: Self-pay | Admitting: *Deleted

## 2020-03-29 DIAGNOSIS — J4540 Moderate persistent asthma, uncomplicated: Secondary | ICD-10-CM | POA: Diagnosis not present

## 2020-04-02 DIAGNOSIS — R0600 Dyspnea, unspecified: Secondary | ICD-10-CM | POA: Diagnosis not present

## 2020-04-02 DIAGNOSIS — R739 Hyperglycemia, unspecified: Secondary | ICD-10-CM | POA: Diagnosis not present

## 2020-04-02 DIAGNOSIS — E039 Hypothyroidism, unspecified: Secondary | ICD-10-CM | POA: Diagnosis not present

## 2020-04-02 DIAGNOSIS — I10 Essential (primary) hypertension: Secondary | ICD-10-CM | POA: Diagnosis not present

## 2020-04-02 DIAGNOSIS — Z79899 Other long term (current) drug therapy: Secondary | ICD-10-CM | POA: Diagnosis not present

## 2020-04-02 DIAGNOSIS — I890 Lymphedema, not elsewhere classified: Secondary | ICD-10-CM | POA: Diagnosis not present

## 2020-04-02 DIAGNOSIS — E7800 Pure hypercholesterolemia, unspecified: Secondary | ICD-10-CM | POA: Diagnosis not present

## 2020-04-07 DIAGNOSIS — I872 Venous insufficiency (chronic) (peripheral): Secondary | ICD-10-CM | POA: Diagnosis not present

## 2020-04-07 DIAGNOSIS — E7800 Pure hypercholesterolemia, unspecified: Secondary | ICD-10-CM | POA: Diagnosis not present

## 2020-04-07 DIAGNOSIS — Z9989 Dependence on other enabling machines and devices: Secondary | ICD-10-CM | POA: Diagnosis not present

## 2020-04-07 DIAGNOSIS — I10 Essential (primary) hypertension: Secondary | ICD-10-CM | POA: Diagnosis not present

## 2020-04-07 DIAGNOSIS — R002 Palpitations: Secondary | ICD-10-CM | POA: Diagnosis not present

## 2020-04-07 DIAGNOSIS — I890 Lymphedema, not elsewhere classified: Secondary | ICD-10-CM | POA: Diagnosis not present

## 2020-04-07 DIAGNOSIS — G4733 Obstructive sleep apnea (adult) (pediatric): Secondary | ICD-10-CM | POA: Diagnosis not present

## 2020-04-07 DIAGNOSIS — R0602 Shortness of breath: Secondary | ICD-10-CM | POA: Diagnosis not present

## 2020-04-09 ENCOUNTER — Encounter: Payer: Self-pay | Admitting: Anesthesiology

## 2020-04-09 DIAGNOSIS — H2512 Age-related nuclear cataract, left eye: Secondary | ICD-10-CM | POA: Diagnosis not present

## 2020-04-09 DIAGNOSIS — J45909 Unspecified asthma, uncomplicated: Secondary | ICD-10-CM | POA: Diagnosis not present

## 2020-04-14 DIAGNOSIS — I10 Essential (primary) hypertension: Secondary | ICD-10-CM | POA: Diagnosis not present

## 2020-04-14 DIAGNOSIS — I879 Disorder of vein, unspecified: Secondary | ICD-10-CM | POA: Diagnosis not present

## 2020-04-14 DIAGNOSIS — I890 Lymphedema, not elsewhere classified: Secondary | ICD-10-CM | POA: Diagnosis not present

## 2020-04-14 DIAGNOSIS — R600 Localized edema: Secondary | ICD-10-CM | POA: Diagnosis not present

## 2020-04-14 DIAGNOSIS — N3000 Acute cystitis without hematuria: Secondary | ICD-10-CM | POA: Diagnosis not present

## 2020-04-14 DIAGNOSIS — R8290 Unspecified abnormal findings in urine: Secondary | ICD-10-CM | POA: Diagnosis not present

## 2020-04-14 DIAGNOSIS — N1831 Chronic kidney disease, stage 3a: Secondary | ICD-10-CM | POA: Diagnosis not present

## 2020-04-19 ENCOUNTER — Ambulatory Visit: Admission: RE | Admit: 2020-04-19 | Payer: Medicare HMO | Source: Home / Self Care | Admitting: Ophthalmology

## 2020-04-19 SURGERY — PHACOEMULSIFICATION, CATARACT, WITH IOL INSERTION
Anesthesia: Topical | Laterality: Left

## 2020-04-21 DIAGNOSIS — G4733 Obstructive sleep apnea (adult) (pediatric): Secondary | ICD-10-CM | POA: Diagnosis not present

## 2020-04-23 DIAGNOSIS — J4540 Moderate persistent asthma, uncomplicated: Secondary | ICD-10-CM | POA: Diagnosis not present

## 2020-04-28 DIAGNOSIS — J4540 Moderate persistent asthma, uncomplicated: Secondary | ICD-10-CM | POA: Diagnosis not present

## 2020-05-04 DIAGNOSIS — R0602 Shortness of breath: Secondary | ICD-10-CM | POA: Diagnosis not present

## 2020-05-10 DIAGNOSIS — E782 Mixed hyperlipidemia: Secondary | ICD-10-CM | POA: Diagnosis not present

## 2020-05-10 DIAGNOSIS — I872 Venous insufficiency (chronic) (peripheral): Secondary | ICD-10-CM | POA: Diagnosis not present

## 2020-05-10 DIAGNOSIS — J309 Allergic rhinitis, unspecified: Secondary | ICD-10-CM | POA: Diagnosis not present

## 2020-05-10 DIAGNOSIS — Z9989 Dependence on other enabling machines and devices: Secondary | ICD-10-CM | POA: Diagnosis not present

## 2020-05-10 DIAGNOSIS — I890 Lymphedema, not elsewhere classified: Secondary | ICD-10-CM | POA: Diagnosis not present

## 2020-05-10 DIAGNOSIS — I10 Essential (primary) hypertension: Secondary | ICD-10-CM | POA: Diagnosis not present

## 2020-05-10 DIAGNOSIS — G4733 Obstructive sleep apnea (adult) (pediatric): Secondary | ICD-10-CM | POA: Diagnosis not present

## 2020-05-22 DIAGNOSIS — G4733 Obstructive sleep apnea (adult) (pediatric): Secondary | ICD-10-CM | POA: Diagnosis not present

## 2020-05-29 DIAGNOSIS — J4540 Moderate persistent asthma, uncomplicated: Secondary | ICD-10-CM | POA: Diagnosis not present

## 2020-06-01 DIAGNOSIS — J4540 Moderate persistent asthma, uncomplicated: Secondary | ICD-10-CM | POA: Diagnosis not present

## 2020-06-21 DIAGNOSIS — G4733 Obstructive sleep apnea (adult) (pediatric): Secondary | ICD-10-CM | POA: Diagnosis not present

## 2020-07-14 DIAGNOSIS — J4540 Moderate persistent asthma, uncomplicated: Secondary | ICD-10-CM | POA: Diagnosis not present

## 2020-07-22 DIAGNOSIS — G4733 Obstructive sleep apnea (adult) (pediatric): Secondary | ICD-10-CM | POA: Diagnosis not present

## 2020-07-29 DIAGNOSIS — R7309 Other abnormal glucose: Secondary | ICD-10-CM | POA: Diagnosis not present

## 2020-07-29 DIAGNOSIS — R8290 Unspecified abnormal findings in urine: Secondary | ICD-10-CM | POA: Diagnosis not present

## 2020-07-29 DIAGNOSIS — I10 Essential (primary) hypertension: Secondary | ICD-10-CM | POA: Diagnosis not present

## 2020-07-29 DIAGNOSIS — Z79899 Other long term (current) drug therapy: Secondary | ICD-10-CM | POA: Diagnosis not present

## 2020-07-29 DIAGNOSIS — E7800 Pure hypercholesterolemia, unspecified: Secondary | ICD-10-CM | POA: Diagnosis not present

## 2020-07-29 DIAGNOSIS — E039 Hypothyroidism, unspecified: Secondary | ICD-10-CM | POA: Diagnosis not present

## 2020-08-12 ENCOUNTER — Other Ambulatory Visit: Payer: Self-pay | Admitting: Internal Medicine

## 2020-08-12 DIAGNOSIS — E079 Disorder of thyroid, unspecified: Secondary | ICD-10-CM | POA: Diagnosis not present

## 2020-08-12 DIAGNOSIS — E785 Hyperlipidemia, unspecified: Secondary | ICD-10-CM | POA: Diagnosis not present

## 2020-08-12 DIAGNOSIS — Z0000 Encounter for general adult medical examination without abnormal findings: Secondary | ICD-10-CM | POA: Diagnosis not present

## 2020-08-12 DIAGNOSIS — I10 Essential (primary) hypertension: Secondary | ICD-10-CM | POA: Diagnosis not present

## 2020-08-12 DIAGNOSIS — Z1231 Encounter for screening mammogram for malignant neoplasm of breast: Secondary | ICD-10-CM | POA: Diagnosis not present

## 2020-08-17 DIAGNOSIS — J4540 Moderate persistent asthma, uncomplicated: Secondary | ICD-10-CM | POA: Diagnosis not present

## 2020-08-19 ENCOUNTER — Other Ambulatory Visit: Payer: Self-pay

## 2020-08-19 ENCOUNTER — Ambulatory Visit
Admission: RE | Admit: 2020-08-19 | Discharge: 2020-08-19 | Disposition: A | Discharge status: Home / Self Care | Payer: Medicare HMO | Source: Ambulatory Visit | Attending: Internal Medicine | Admitting: Internal Medicine

## 2020-08-19 DIAGNOSIS — Z1231 Encounter for screening mammogram for malignant neoplasm of breast: Secondary | ICD-10-CM

## 2020-08-22 DIAGNOSIS — G4733 Obstructive sleep apnea (adult) (pediatric): Secondary | ICD-10-CM | POA: Diagnosis not present

## 2020-09-21 DIAGNOSIS — G4733 Obstructive sleep apnea (adult) (pediatric): Secondary | ICD-10-CM | POA: Diagnosis not present

## 2020-09-28 DIAGNOSIS — E079 Disorder of thyroid, unspecified: Secondary | ICD-10-CM | POA: Diagnosis not present

## 2020-09-28 DIAGNOSIS — Z79899 Other long term (current) drug therapy: Secondary | ICD-10-CM | POA: Diagnosis not present

## 2020-09-28 DIAGNOSIS — E785 Hyperlipidemia, unspecified: Secondary | ICD-10-CM | POA: Diagnosis not present

## 2020-09-28 DIAGNOSIS — I10 Essential (primary) hypertension: Secondary | ICD-10-CM | POA: Diagnosis not present

## 2020-09-28 DIAGNOSIS — Z0000 Encounter for general adult medical examination without abnormal findings: Secondary | ICD-10-CM | POA: Diagnosis not present

## 2020-10-07 DIAGNOSIS — J4540 Moderate persistent asthma, uncomplicated: Secondary | ICD-10-CM | POA: Diagnosis not present

## 2020-10-13 DIAGNOSIS — N1831 Chronic kidney disease, stage 3a: Secondary | ICD-10-CM | POA: Diagnosis not present

## 2020-10-13 DIAGNOSIS — I10 Essential (primary) hypertension: Secondary | ICD-10-CM | POA: Diagnosis not present

## 2020-10-13 DIAGNOSIS — R600 Localized edema: Secondary | ICD-10-CM | POA: Diagnosis not present

## 2020-10-22 DIAGNOSIS — G4733 Obstructive sleep apnea (adult) (pediatric): Secondary | ICD-10-CM | POA: Diagnosis not present

## 2020-11-11 DIAGNOSIS — G4733 Obstructive sleep apnea (adult) (pediatric): Secondary | ICD-10-CM | POA: Diagnosis not present

## 2020-11-11 DIAGNOSIS — J4520 Mild intermittent asthma, uncomplicated: Secondary | ICD-10-CM | POA: Diagnosis not present

## 2020-11-11 DIAGNOSIS — Z9989 Dependence on other enabling machines and devices: Secondary | ICD-10-CM | POA: Diagnosis not present

## 2020-11-11 DIAGNOSIS — I10 Essential (primary) hypertension: Secondary | ICD-10-CM | POA: Diagnosis not present

## 2020-11-11 DIAGNOSIS — E7800 Pure hypercholesterolemia, unspecified: Secondary | ICD-10-CM | POA: Diagnosis not present

## 2020-11-11 DIAGNOSIS — E782 Mixed hyperlipidemia: Secondary | ICD-10-CM | POA: Diagnosis not present

## 2020-11-11 DIAGNOSIS — I890 Lymphedema, not elsewhere classified: Secondary | ICD-10-CM | POA: Diagnosis not present

## 2020-11-11 DIAGNOSIS — I872 Venous insufficiency (chronic) (peripheral): Secondary | ICD-10-CM | POA: Diagnosis not present

## 2021-02-24 ENCOUNTER — Ambulatory Visit: Payer: Medicare Other | Attending: Neurology

## 2021-02-24 DIAGNOSIS — G4733 Obstructive sleep apnea (adult) (pediatric): Secondary | ICD-10-CM | POA: Insufficient documentation

## 2021-02-25 ENCOUNTER — Other Ambulatory Visit: Payer: Self-pay

## 2021-05-18 ENCOUNTER — Other Ambulatory Visit: Payer: Self-pay | Admitting: Internal Medicine

## 2021-05-18 DIAGNOSIS — Z1231 Encounter for screening mammogram for malignant neoplasm of breast: Secondary | ICD-10-CM

## 2021-05-26 ENCOUNTER — Telehealth (INDEPENDENT_AMBULATORY_CARE_PROVIDER_SITE_OTHER): Payer: Self-pay

## 2021-05-26 ENCOUNTER — Encounter (INDEPENDENT_AMBULATORY_CARE_PROVIDER_SITE_OTHER): Payer: Self-pay | Admitting: Nurse Practitioner

## 2021-05-26 NOTE — Telephone Encounter (Signed)
Pts daughter called and left a Vm on the nurses line wanting   to know if we still wanted her mom to come in for her 3:00 PM appointment today, that she has a sore throat and a fever . I called the pt's daughter back and  made her aware that with those symptoms we would suggest that she see her PCP and  reschedule once she feels better.

## 2021-06-30 ENCOUNTER — Ambulatory Visit (INDEPENDENT_AMBULATORY_CARE_PROVIDER_SITE_OTHER): Payer: Medicare Other | Admitting: Vascular Surgery

## 2021-06-30 ENCOUNTER — Other Ambulatory Visit: Payer: Self-pay

## 2021-06-30 ENCOUNTER — Encounter (INDEPENDENT_AMBULATORY_CARE_PROVIDER_SITE_OTHER): Payer: Self-pay | Admitting: Vascular Surgery

## 2021-06-30 VITALS — BP 138/80 | HR 77 | Ht 66.0 in | Wt 202.0 lb

## 2021-06-30 DIAGNOSIS — I10 Essential (primary) hypertension: Secondary | ICD-10-CM

## 2021-06-30 DIAGNOSIS — E785 Hyperlipidemia, unspecified: Secondary | ICD-10-CM | POA: Diagnosis not present

## 2021-06-30 DIAGNOSIS — I872 Venous insufficiency (chronic) (peripheral): Secondary | ICD-10-CM

## 2021-06-30 DIAGNOSIS — I890 Lymphedema, not elsewhere classified: Secondary | ICD-10-CM
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
## 2021-10-31 ENCOUNTER — Encounter: Payer: Self-pay | Admitting: Cardiology

## 2021-10-31 DIAGNOSIS — I509 Heart failure, unspecified: Secondary | ICD-10-CM | POA: Insufficient documentation

## 2022-01-31 ENCOUNTER — Other Ambulatory Visit: Payer: Self-pay

## 2022-01-31 ENCOUNTER — Ambulatory Visit
Admission: RE | Admit: 2022-01-31 | Discharge: 2022-01-31 | Disposition: A | Discharge status: Home / Self Care | Payer: Medicare (Managed Care) | Source: Ambulatory Visit | Attending: Internal Medicine | Admitting: Internal Medicine

## 2022-01-31 DIAGNOSIS — Z1231 Encounter for screening mammogram for malignant neoplasm of breast: Secondary | ICD-10-CM | POA: Insufficient documentation

## 2022-07-18 ENCOUNTER — Encounter: Payer: Self-pay | Admitting: Ophthalmology

## 2022-07-20 NOTE — Discharge Instructions (Signed)

## 2022-07-21 MED ORDER — LIDOCAINE HCL (PF) 1 % IJ SOLN
INTRAMUSCULAR | Status: AC
  Filled 2022-07-21: qty 30

## 2022-07-21 MED ORDER — BUPIVACAINE-EPINEPHRINE (PF) 0.5% -1:200000 IJ SOLN
INTRAMUSCULAR | Status: AC
  Filled 2022-07-21: qty 30

## 2022-07-21 MED ORDER — GENTAMICIN SULFATE 40 MG/ML IJ SOLN
INTRAMUSCULAR | Status: AC
  Filled 2022-07-21: qty 2

## 2022-07-24 ENCOUNTER — Encounter: Payer: Self-pay | Admitting: Ophthalmology

## 2022-07-24 ENCOUNTER — Encounter
Admission: RE | Disposition: A | Discharge status: Home / Self Care | Payer: Self-pay | Source: Home / Self Care | Attending: Ophthalmology

## 2022-07-24 ENCOUNTER — Ambulatory Visit (AMBULATORY_SURGERY_CENTER): Payer: Medicare (Managed Care) | Admitting: Anesthesiology

## 2022-07-24 ENCOUNTER — Ambulatory Visit: Payer: Medicare (Managed Care) | Admitting: Anesthesiology

## 2022-07-24 ENCOUNTER — Other Ambulatory Visit: Payer: Self-pay

## 2022-07-24 ENCOUNTER — Ambulatory Visit
Admission: RE | Admit: 2022-07-24 | Discharge: 2022-07-24 | Disposition: A | Discharge status: Home / Self Care | Payer: Medicare (Managed Care) | Attending: Ophthalmology | Admitting: Ophthalmology

## 2022-07-24 DIAGNOSIS — Z79899 Other long term (current) drug therapy: Secondary | ICD-10-CM | POA: Diagnosis not present

## 2022-07-24 DIAGNOSIS — I509 Heart failure, unspecified: Secondary | ICD-10-CM | POA: Diagnosis not present

## 2022-07-24 DIAGNOSIS — I130 Hypertensive heart and chronic kidney disease with heart failure and stage 1 through stage 4 chronic kidney disease, or unspecified chronic kidney disease: Secondary | ICD-10-CM | POA: Insufficient documentation

## 2022-07-24 DIAGNOSIS — E1122 Type 2 diabetes mellitus with diabetic chronic kidney disease: Secondary | ICD-10-CM | POA: Insufficient documentation

## 2022-07-24 DIAGNOSIS — Z86711 Personal history of pulmonary embolism: Secondary | ICD-10-CM | POA: Diagnosis not present

## 2022-07-24 DIAGNOSIS — J45909 Unspecified asthma, uncomplicated: Secondary | ICD-10-CM | POA: Diagnosis not present

## 2022-07-24 DIAGNOSIS — E119 Type 2 diabetes mellitus without complications: Secondary | ICD-10-CM

## 2022-07-24 DIAGNOSIS — N183 Chronic kidney disease, stage 3 unspecified: Secondary | ICD-10-CM | POA: Insufficient documentation

## 2022-07-24 DIAGNOSIS — Z79890 Hormone replacement therapy (postmenopausal): Secondary | ICD-10-CM | POA: Diagnosis not present

## 2022-07-24 DIAGNOSIS — I110 Hypertensive heart disease with heart failure: Secondary | ICD-10-CM | POA: Diagnosis not present

## 2022-07-24 DIAGNOSIS — Z7951 Long term (current) use of inhaled steroids: Secondary | ICD-10-CM | POA: Diagnosis not present

## 2022-07-24 DIAGNOSIS — E039 Hypothyroidism, unspecified: Secondary | ICD-10-CM | POA: Diagnosis not present

## 2022-07-24 DIAGNOSIS — H2512 Age-related nuclear cataract, left eye: Secondary | ICD-10-CM | POA: Diagnosis not present

## 2022-07-24 DIAGNOSIS — E1136 Type 2 diabetes mellitus with diabetic cataract: Secondary | ICD-10-CM | POA: Insufficient documentation

## 2022-07-24 DIAGNOSIS — Z86718 Personal history of other venous thrombosis and embolism: Secondary | ICD-10-CM | POA: Diagnosis not present

## 2022-07-24 DIAGNOSIS — G4730 Sleep apnea, unspecified: Secondary | ICD-10-CM | POA: Insufficient documentation

## 2022-07-24 HISTORY — PX: CATARACT EXTRACTION W/PHACO: SHX586

## 2022-07-24 SURGERY — PHACOEMULSIFICATION, CATARACT, WITH IOL INSERTION
Anesthesia: Monitor Anesthesia Care | Site: Eye | Laterality: Left

## 2022-07-24 MED ORDER — BRIMONIDINE TARTRATE-TIMOLOL 0.2-0.5 % OP SOLN
OPHTHALMIC | Status: DC | PRN
  Administered 2022-07-24: 1 [drp] via OPHTHALMIC

## 2022-07-24 MED ORDER — MOXIFLOXACIN HCL 0.5 % OP SOLN
OPHTHALMIC | Status: DC | PRN
  Administered 2022-07-24: 0.2 mL via OPHTHALMIC

## 2022-07-24 MED ORDER — CARBACHOL 0.01 % IO SOLN
INTRAOCULAR | Status: DC | PRN
  Administered 2022-07-24: .02 mL via INTRAOCULAR

## 2022-07-24 MED ORDER — TETRACAINE HCL 0.5 % OP SOLN
1.0000 [drp] | OPHTHALMIC | Status: DC | PRN
  Administered 2022-07-24 (×3): 1 [drp] via OPHTHALMIC

## 2022-07-24 MED ORDER — SIGHTPATH DOSE#1 BSS IO SOLN
INTRAOCULAR | Status: DC | PRN
  Administered 2022-07-24: 15 mL

## 2022-07-24 MED ORDER — LIDOCAINE HCL (PF) 2 % IJ SOLN
INTRAOCULAR | Status: DC | PRN
  Administered 2022-07-24: 1 mL via INTRAOCULAR

## 2022-07-24 MED ORDER — ARMC OPHTHALMIC DILATING DROPS
1.0000 | OPHTHALMIC | Status: DC | PRN
  Administered 2022-07-24 (×3): 1 via OPHTHALMIC

## 2022-07-24 MED ORDER — ONDANSETRON HCL 4 MG/2ML IJ SOLN
4.0000 mg | Freq: Once | INTRAMUSCULAR | Status: DC | PRN
Start: 2022-07-24 — End: 2022-07-24

## 2022-07-24 MED ORDER — FENTANYL CITRATE PF 50 MCG/ML IJ SOSY: 25.0000 ug | PREFILLED_SYRINGE | INTRAMUSCULAR | Status: DC | PRN

## 2022-07-24 MED ORDER — FENTANYL CITRATE (PF) 100 MCG/2ML IJ SOLN
INTRAMUSCULAR | Status: DC | PRN
  Administered 2022-07-24: 50 ug via INTRAVENOUS

## 2022-07-24 MED ORDER — LACTATED RINGERS IV SOLN: INTRAVENOUS | Status: DC

## 2022-07-24 MED ORDER — SIGHTPATH DOSE#1 BSS IO SOLN
INTRAOCULAR | Status: DC | PRN
  Administered 2022-07-24: 98 mL via OPHTHALMIC

## 2022-07-24 MED ORDER — MIDAZOLAM HCL 2 MG/2ML IJ SOLN
INTRAMUSCULAR | Status: DC | PRN
  Administered 2022-07-24: 1 mg via INTRAVENOUS

## 2022-07-24 MED ORDER — SIGHTPATH DOSE#1 NA HYALUR & NA CHOND-NA HYALUR IO KIT
PACK | INTRAOCULAR | Status: DC | PRN
  Administered 2022-07-24: 1 via OPHTHALMIC

## 2022-07-24 SURGICAL SUPPLY — 17 items
CANNULA ANT/CHMB 27G (MISCELLANEOUS) IMPLANT
CANNULA ANT/CHMB 27GA (MISCELLANEOUS) ×1 IMPLANT
CATARACT SUITE SIGHTPATH (MISCELLANEOUS) ×1 IMPLANT
DISSECTOR HYDRO NUCLEUS 50X22 (MISCELLANEOUS) ×1 IMPLANT
FEE CATARACT SUITE SIGHTPATH (MISCELLANEOUS) ×1 IMPLANT
GLOVE SURG GAMMEX PI TX LF 7.5 (GLOVE) ×1 IMPLANT
GLOVE SURG SYN 8.5  E (GLOVE) ×1
GLOVE SURG SYN 8.5 E (GLOVE) ×1 IMPLANT
GLOVE SURG SYN 8.5 PF PI (GLOVE) ×1 IMPLANT
LENS IOL TECNIS EYHANCE 26.0 (Intraocular Lens) IMPLANT
NDL FILTER BLUNT 18X1 1/2 (NEEDLE) ×1 IMPLANT
NEEDLE FILTER BLUNT 18X 1/2SAF (NEEDLE) ×1
NEEDLE FILTER BLUNT 18X1 1/2 (NEEDLE) ×1 IMPLANT
SPONGE SURG I SPEAR (MISCELLANEOUS) IMPLANT
SYR 3ML LL SCALE MARK (SYRINGE) ×1 IMPLANT
SYR 5ML LL (SYRINGE) ×1 IMPLANT
WATER STERILE IRR 250ML POUR (IV SOLUTION) ×1 IMPLANT

## 2022-07-24 NOTE — Op Note (Signed)
OPERATIVE NOTE  Nancy Moyer [id number] 07/24/2022   PREOPERATIVE DIAGNOSIS:  Nuclear sclerotic cataract left eye.  H25.12   POSTOPERATIVE DIAGNOSIS:    Nuclear sclerotic cataract left eye.     PROCEDURE:  CPT [id number], Complex Phacoemusification with posterior chamber intraocular lens placement of the left eye, with zonular laxity requiring placement of a capsular tension ring.  LENS:   Implant Name Type Inv. Item Serial No. Manufacturer Lot No. LRB No. Used Action  LENS IOL TECNIS EYHANCE 26.0 - [id number] Intraocular Lens LENS IOL TECNIS EYHANCE 26.0 [id number] [employer]  Left 1 Implanted      Procedure(s) with comments: CATARACT EXTRACTION PHACO AND INTRAOCULAR LENS PLACEMENT (IOC) LEFT 4.35 00:36.7 (Left) - sleep apnea  DIB00 +26.0   ULTRASOUND TIME: 0 minutes 36 seconds.  CDE 4.35   SURGEON:  Benay Pillow, MD, MPH   ANESTHESIA:  Topical with tetracaine drops augmented with 1% preservative-free intracameral lidocaine.  ESTIMATED BLOOD LOSS: <1 mL   COMPLICATIONS:  zonular laxity, zonular dehisence at 7:00 - 10:00.   DESCRIPTION OF PROCEDURE:  The patient was identified in the holding room and transported to the operating room and placed in the supine position under the operating microscope.  The left eye was identified as the operative eye and it was prepped and draped in the usual sterile ophthalmic fashion.   A 1.0 millimeter clear-corneal paracentesis was made at the 5:00 position. 0.5 ml of preservative-free 1% lidocaine with epinephrine was injected into the anterior chamber.  The anterior chamber was filled with viscoelastic.  A 2.4 millimeter keratome was used to make a near-clear corneal incision at the 2:00 position.  A curvilinear capsulorrhexis was made with a cystotome and capsulorrhexis forceps.  Balanced salt solution was used to hydrodissect and hydrodelineate the nucleus.   Phacoemulsification was then used in stop and chop fashion to remove the lens nucleus  and epinucleus.  The remaining cortex was then removed using the irrigation and aspiration handpiece. Viscoelastic was then placed into the capsular bag to distend it for lens placement.  A lens was then injected into the capsular bag.    At the time of placement of the lens a zonular dehiscence was noted from 7:00 - 10:00.  A capsular tension ring was placed.   The remaining viscoelastic was irrigated using syringes of BSS. Miostat was injected and the pupil was round.  The lens was well centered.  The anterior and posterior capsule remained intact with a round rhexis.   Wounds were hydrated with balanced salt solution.  The anterior chamber was inflated to a physiologic pressure with balanced salt solution.  Intracameral vigamox 0.1 mL undiltued was injected into the eye and a drop placed onto the ocular surface.   No wound leaks were noted.   Combigan drops were placed. The patient was taken to the recovery room in stable condition without complications of anesthesia or surgery  Benay Pillow 07/24/2022, 10:54 AM

## 2022-07-24 NOTE — Anesthesia Postprocedure Evaluation (Signed)
Anesthesia Post Note  Patient: Nancy Moyer  Procedure(s) Performed: CATARACT EXTRACTION PHACO AND INTRAOCULAR LENS PLACEMENT (IOC) LEFT 4.35 00:36.7 (Left: Eye)     Patient location during evaluation: PACU Anesthesia Type: MAC Level of consciousness: awake and alert Pain management: pain level controlled Vital Signs Assessment: post-procedure vital signs reviewed and stable Respiratory status: spontaneous breathing, nonlabored ventilation, respiratory function stable and patient connected to nasal cannula oxygen Cardiovascular status: stable and blood pressure returned to baseline Postop Assessment: no apparent nausea or vomiting Anesthetic complications: no   No notable events documented.  Molli Barrows
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
## 2022-07-24 NOTE — Transfer of Care (Signed)
Immediate Anesthesia Transfer of Care Note  Patient: Nancy Moyer  Procedure(s) Performed: CATARACT EXTRACTION PHACO AND INTRAOCULAR LENS PLACEMENT (IOC) LEFT 4.35 00:36.7 (Left: Eye)  Patient Location: PACU  Anesthesia Type: No value filed.  Level of Consciousness: awake, alert  and patient cooperative  Airway and Oxygen Therapy: Patient Spontanous Breathing   Post-op Assessment: Post-op Vital signs reviewed, Patient's Cardiovascular Status Stable, Respiratory Function Stable, Patent Airway and No signs of Nausea or vomiting  Post-op Vital Signs: Reviewed and stable  Complications: No notable events documented.

## 2022-07-24 NOTE — Anesthesia Preprocedure Evaluation (Signed)
Anesthesia Evaluation  Patient identified by MRN, date of birth, ID band Patient awake    Reviewed: Allergy & Precautions, H&P , NPO status , Patient's Chart, lab work & pertinent test results, reviewed documented beta blocker date and time   Airway Mallampati: III  TM Distance: >3 FB Neck ROM: full    Dental no notable dental hx. (+) Teeth Intact   Pulmonary shortness of breath and with exertion, asthma , sleep apnea ,    Pulmonary exam normal breath sounds clear to auscultation       Cardiovascular Exercise Tolerance: Poor hypertension, On Medications +CHF   Rhythm:regular Rate:Normal     Neuro/Psych  Headaches, negative psych ROS   GI/Hepatic negative GI ROS, Neg liver ROS,   Endo/Other  diabetesHypothyroidism   Renal/GU Renal disease     Musculoskeletal   Abdominal   Peds  Hematology negative hematology ROS (+)   Anesthesia Other Findings   Reproductive/Obstetrics negative OB ROS                             Anesthesia Physical Anesthesia Plan  ASA: 3  Anesthesia Plan: MAC   Post-op Pain Management:    Induction:   PONV Risk Score and Plan:   Airway Management Planned:   Additional Equipment:   Intra-op Plan:   Post-operative Plan:   Informed Consent: I have reviewed the patients History and Physical, chart, labs and discussed the procedure including the risks, benefits and alternatives for the proposed anesthesia with the patient or authorized representative who has indicated his/her understanding and acceptance.       Plan Discussed with: CRNA  Anesthesia Plan Comments:         Anesthesia Quick Evaluation

## 2022-07-25 ENCOUNTER — Encounter: Payer: Self-pay | Admitting: Ophthalmology

## 2022-12-28 ENCOUNTER — Other Ambulatory Visit: Payer: Self-pay | Admitting: Internal Medicine

## 2022-12-28 DIAGNOSIS — Z1231 Encounter for screening mammogram for malignant neoplasm of breast: Secondary | ICD-10-CM

## 2023-01-08 DIAGNOSIS — G8918 Other acute postprocedural pain: Secondary | ICD-10-CM | POA: Diagnosis not present

## 2023-01-11 DIAGNOSIS — Z0000 Encounter for general adult medical examination without abnormal findings: Secondary | ICD-10-CM | POA: Diagnosis not present

## 2023-01-11 DIAGNOSIS — E785 Hyperlipidemia, unspecified: Secondary | ICD-10-CM | POA: Diagnosis not present

## 2023-01-11 DIAGNOSIS — E079 Disorder of thyroid, unspecified: Secondary | ICD-10-CM | POA: Diagnosis not present

## 2023-01-11 DIAGNOSIS — I890 Lymphedema, not elsewhere classified: Secondary | ICD-10-CM | POA: Diagnosis not present

## 2023-01-11 DIAGNOSIS — I10 Essential (primary) hypertension: Secondary | ICD-10-CM | POA: Diagnosis not present

## 2023-01-11 DIAGNOSIS — E039 Hypothyroidism, unspecified: Secondary | ICD-10-CM | POA: Diagnosis not present

## 2023-01-11 DIAGNOSIS — Z79899 Other long term (current) drug therapy: Secondary | ICD-10-CM | POA: Diagnosis not present

## 2023-01-11 DIAGNOSIS — R7303 Prediabetes: Secondary | ICD-10-CM | POA: Diagnosis not present

## 2023-02-01 ENCOUNTER — Ambulatory Visit
Admission: RE | Admit: 2023-02-01 | Discharge: 2023-02-01 | Disposition: A | Discharge status: Home / Self Care | Payer: Medicare HMO | Source: Ambulatory Visit | Attending: Internal Medicine | Admitting: Internal Medicine

## 2023-02-01 DIAGNOSIS — Z1231 Encounter for screening mammogram for malignant neoplasm of breast: Secondary | ICD-10-CM | POA: Diagnosis not present

## 2023-02-06 DIAGNOSIS — R399 Unspecified symptoms and signs involving the genitourinary system: Secondary | ICD-10-CM | POA: Diagnosis not present

## 2023-02-13 DIAGNOSIS — I10 Essential (primary) hypertension: Secondary | ICD-10-CM | POA: Diagnosis not present

## 2023-02-13 DIAGNOSIS — N1831 Chronic kidney disease, stage 3a: Secondary | ICD-10-CM | POA: Diagnosis not present

## 2023-02-13 DIAGNOSIS — I890 Lymphedema, not elsewhere classified: Secondary | ICD-10-CM | POA: Diagnosis not present

## 2023-02-13 DIAGNOSIS — R600 Localized edema: Secondary | ICD-10-CM | POA: Diagnosis not present

## 2023-02-13 DIAGNOSIS — I129 Hypertensive chronic kidney disease with stage 1 through stage 4 chronic kidney disease, or unspecified chronic kidney disease: Secondary | ICD-10-CM | POA: Diagnosis not present

## 2023-02-13 DIAGNOSIS — I879 Disorder of vein, unspecified: Secondary | ICD-10-CM | POA: Diagnosis not present

## 2023-02-19 DIAGNOSIS — I879 Disorder of vein, unspecified: Secondary | ICD-10-CM | POA: Diagnosis not present

## 2023-02-19 DIAGNOSIS — I890 Lymphedema, not elsewhere classified: Secondary | ICD-10-CM | POA: Diagnosis not present

## 2023-02-19 DIAGNOSIS — I10 Essential (primary) hypertension: Secondary | ICD-10-CM | POA: Diagnosis not present

## 2023-02-19 DIAGNOSIS — N1831 Chronic kidney disease, stage 3a: Secondary | ICD-10-CM | POA: Diagnosis not present

## 2023-02-19 DIAGNOSIS — I129 Hypertensive chronic kidney disease with stage 1 through stage 4 chronic kidney disease, or unspecified chronic kidney disease: Secondary | ICD-10-CM | POA: Diagnosis not present

## 2023-04-09 DIAGNOSIS — G4733 Obstructive sleep apnea (adult) (pediatric): Secondary | ICD-10-CM | POA: Diagnosis not present

## 2023-04-09 DIAGNOSIS — I872 Venous insufficiency (chronic) (peripheral): Secondary | ICD-10-CM | POA: Diagnosis not present

## 2023-04-09 DIAGNOSIS — I10 Essential (primary) hypertension: Secondary | ICD-10-CM | POA: Diagnosis not present

## 2023-04-09 DIAGNOSIS — E7800 Pure hypercholesterolemia, unspecified: Secondary | ICD-10-CM | POA: Diagnosis not present

## 2023-04-09 DIAGNOSIS — Z86718 Personal history of other venous thrombosis and embolism: Secondary | ICD-10-CM | POA: Diagnosis not present

## 2023-04-09 DIAGNOSIS — E782 Mixed hyperlipidemia: Secondary | ICD-10-CM | POA: Diagnosis not present

## 2023-04-09 DIAGNOSIS — I890 Lymphedema, not elsewhere classified: Secondary | ICD-10-CM | POA: Diagnosis not present

## 2023-04-13 DIAGNOSIS — E039 Hypothyroidism, unspecified: Secondary | ICD-10-CM | POA: Diagnosis not present

## 2023-04-13 DIAGNOSIS — I10 Essential (primary) hypertension: Secondary | ICD-10-CM | POA: Diagnosis not present

## 2023-04-13 DIAGNOSIS — R1010 Upper abdominal pain, unspecified: Secondary | ICD-10-CM | POA: Diagnosis not present

## 2023-04-13 DIAGNOSIS — E7800 Pure hypercholesterolemia, unspecified: Secondary | ICD-10-CM | POA: Diagnosis not present

## 2023-04-13 DIAGNOSIS — R7303 Prediabetes: Secondary | ICD-10-CM | POA: Diagnosis not present

## 2023-04-13 DIAGNOSIS — Z79899 Other long term (current) drug therapy: Secondary | ICD-10-CM | POA: Diagnosis not present

## 2023-04-13 DIAGNOSIS — G4733 Obstructive sleep apnea (adult) (pediatric): Secondary | ICD-10-CM | POA: Diagnosis not present

## 2023-04-23 DIAGNOSIS — R1010 Upper abdominal pain, unspecified: Secondary | ICD-10-CM | POA: Diagnosis not present

## 2023-04-23 DIAGNOSIS — R0781 Pleurodynia: Secondary | ICD-10-CM | POA: Diagnosis not present

## 2023-04-23 DIAGNOSIS — R1012 Left upper quadrant pain: Secondary | ICD-10-CM | POA: Diagnosis not present

## 2023-05-09 DIAGNOSIS — I10 Essential (primary) hypertension: Secondary | ICD-10-CM | POA: Diagnosis not present

## 2023-05-09 DIAGNOSIS — R42 Dizziness and giddiness: Secondary | ICD-10-CM | POA: Diagnosis not present

## 2023-05-09 DIAGNOSIS — R1010 Upper abdominal pain, unspecified: Secondary | ICD-10-CM | POA: Diagnosis not present

## 2023-05-09 DIAGNOSIS — R8290 Unspecified abnormal findings in urine: Secondary | ICD-10-CM | POA: Diagnosis not present

## 2023-05-09 DIAGNOSIS — E785 Hyperlipidemia, unspecified: Secondary | ICD-10-CM | POA: Diagnosis not present

## 2023-05-09 DIAGNOSIS — R7303 Prediabetes: Secondary | ICD-10-CM | POA: Diagnosis not present

## 2023-05-09 DIAGNOSIS — E079 Disorder of thyroid, unspecified: Secondary | ICD-10-CM | POA: Diagnosis not present

## 2023-05-11 ENCOUNTER — Other Ambulatory Visit: Payer: Self-pay | Admitting: Internal Medicine

## 2023-05-11 DIAGNOSIS — R42 Dizziness and giddiness: Secondary | ICD-10-CM

## 2023-05-11 DIAGNOSIS — I10 Essential (primary) hypertension: Secondary | ICD-10-CM

## 2023-05-11 DIAGNOSIS — R1012 Left upper quadrant pain: Secondary | ICD-10-CM

## 2023-05-22 ENCOUNTER — Ambulatory Visit
Admission: RE | Admit: 2023-05-22 | Discharge: 2023-05-22 | Disposition: A | Discharge status: Home / Self Care | Payer: Medicare HMO | Source: Ambulatory Visit | Attending: Internal Medicine | Admitting: Internal Medicine

## 2023-05-22 DIAGNOSIS — R1012 Left upper quadrant pain: Secondary | ICD-10-CM | POA: Insufficient documentation

## 2023-05-22 DIAGNOSIS — R42 Dizziness and giddiness: Secondary | ICD-10-CM | POA: Insufficient documentation

## 2023-05-22 DIAGNOSIS — I10 Essential (primary) hypertension: Secondary | ICD-10-CM | POA: Diagnosis not present

## 2023-05-22 DIAGNOSIS — N200 Calculus of kidney: Secondary | ICD-10-CM | POA: Diagnosis not present

## 2023-06-11 DIAGNOSIS — K219 Gastro-esophageal reflux disease without esophagitis: Secondary | ICD-10-CM | POA: Diagnosis not present

## 2023-06-11 DIAGNOSIS — R1314 Dysphagia, pharyngoesophageal phase: Secondary | ICD-10-CM | POA: Diagnosis not present

## 2023-06-18 ENCOUNTER — Other Ambulatory Visit: Payer: Self-pay | Admitting: Gastroenterology

## 2023-06-18 DIAGNOSIS — K219 Gastro-esophageal reflux disease without esophagitis: Secondary | ICD-10-CM

## 2023-06-18 DIAGNOSIS — R1314 Dysphagia, pharyngoesophageal phase: Secondary | ICD-10-CM

## 2023-06-22 ENCOUNTER — Ambulatory Visit
Admission: RE | Admit: 2023-06-22 | Discharge: 2023-06-22 | Disposition: A | Discharge status: Home / Self Care | Payer: Medicare HMO | Source: Ambulatory Visit | Attending: Gastroenterology | Admitting: Gastroenterology

## 2023-06-22 DIAGNOSIS — K219 Gastro-esophageal reflux disease without esophagitis: Secondary | ICD-10-CM | POA: Insufficient documentation

## 2023-06-22 DIAGNOSIS — R079 Chest pain, unspecified: Secondary | ICD-10-CM | POA: Diagnosis not present

## 2023-06-22 DIAGNOSIS — K449 Diaphragmatic hernia without obstruction or gangrene: Secondary | ICD-10-CM | POA: Diagnosis not present

## 2023-06-22 DIAGNOSIS — R1314 Dysphagia, pharyngoesophageal phase: Secondary | ICD-10-CM | POA: Diagnosis not present

## 2023-06-22 DIAGNOSIS — K224 Dyskinesia of esophagus: Secondary | ICD-10-CM | POA: Diagnosis not present

## 2023-07-04 DIAGNOSIS — I77810 Thoracic aortic ectasia: Secondary | ICD-10-CM | POA: Diagnosis not present

## 2023-07-04 DIAGNOSIS — E782 Mixed hyperlipidemia: Secondary | ICD-10-CM | POA: Diagnosis not present

## 2023-07-04 DIAGNOSIS — R0789 Other chest pain: Secondary | ICD-10-CM | POA: Diagnosis not present

## 2023-07-04 DIAGNOSIS — Z86718 Personal history of other venous thrombosis and embolism: Secondary | ICD-10-CM | POA: Diagnosis not present

## 2023-07-04 DIAGNOSIS — N183 Chronic kidney disease, stage 3 unspecified: Secondary | ICD-10-CM | POA: Diagnosis not present

## 2023-07-04 DIAGNOSIS — I890 Lymphedema, not elsewhere classified: Secondary | ICD-10-CM | POA: Diagnosis not present

## 2023-07-04 DIAGNOSIS — I10 Essential (primary) hypertension: Secondary | ICD-10-CM | POA: Diagnosis not present

## 2023-07-13 DIAGNOSIS — I77810 Thoracic aortic ectasia: Secondary | ICD-10-CM | POA: Diagnosis not present

## 2023-07-13 DIAGNOSIS — R0789 Other chest pain: Secondary | ICD-10-CM | POA: Diagnosis not present

## 2023-07-19 DIAGNOSIS — G4733 Obstructive sleep apnea (adult) (pediatric): Secondary | ICD-10-CM | POA: Diagnosis not present

## 2023-07-19 DIAGNOSIS — I890 Lymphedema, not elsewhere classified: Secondary | ICD-10-CM | POA: Diagnosis not present

## 2023-07-19 DIAGNOSIS — Z79899 Other long term (current) drug therapy: Secondary | ICD-10-CM | POA: Diagnosis not present

## 2023-07-19 DIAGNOSIS — R7303 Prediabetes: Secondary | ICD-10-CM | POA: Diagnosis not present

## 2023-07-19 DIAGNOSIS — I10 Essential (primary) hypertension: Secondary | ICD-10-CM | POA: Diagnosis not present

## 2023-07-19 DIAGNOSIS — E7800 Pure hypercholesterolemia, unspecified: Secondary | ICD-10-CM | POA: Diagnosis not present

## 2023-07-19 DIAGNOSIS — E039 Hypothyroidism, unspecified: Secondary | ICD-10-CM | POA: Diagnosis not present

## 2023-08-02 DIAGNOSIS — I10 Essential (primary) hypertension: Secondary | ICD-10-CM | POA: Diagnosis not present

## 2023-08-02 DIAGNOSIS — E782 Mixed hyperlipidemia: Secondary | ICD-10-CM | POA: Diagnosis not present

## 2023-08-02 DIAGNOSIS — R0602 Shortness of breath: Secondary | ICD-10-CM | POA: Diagnosis not present

## 2023-08-02 DIAGNOSIS — I77810 Thoracic aortic ectasia: Secondary | ICD-10-CM | POA: Diagnosis not present

## 2023-08-08 DIAGNOSIS — R413 Other amnesia: Secondary | ICD-10-CM | POA: Diagnosis not present

## 2023-08-08 DIAGNOSIS — R1319 Other dysphagia: Secondary | ICD-10-CM | POA: Diagnosis not present

## 2023-08-08 DIAGNOSIS — R0781 Pleurodynia: Secondary | ICD-10-CM | POA: Diagnosis not present

## 2023-08-08 DIAGNOSIS — K219 Gastro-esophageal reflux disease without esophagitis: Secondary | ICD-10-CM | POA: Diagnosis not present

## 2023-08-13 DIAGNOSIS — R1013 Epigastric pain: Secondary | ICD-10-CM | POA: Diagnosis not present

## 2023-08-13 DIAGNOSIS — N1831 Chronic kidney disease, stage 3a: Secondary | ICD-10-CM | POA: Diagnosis not present

## 2023-08-13 DIAGNOSIS — R600 Localized edema: Secondary | ICD-10-CM | POA: Diagnosis not present

## 2023-08-13 DIAGNOSIS — I129 Hypertensive chronic kidney disease with stage 1 through stage 4 chronic kidney disease, or unspecified chronic kidney disease: Secondary | ICD-10-CM | POA: Diagnosis not present

## 2023-08-13 DIAGNOSIS — I879 Disorder of vein, unspecified: Secondary | ICD-10-CM | POA: Diagnosis not present

## 2023-08-13 DIAGNOSIS — I10 Essential (primary) hypertension: Secondary | ICD-10-CM | POA: Diagnosis not present

## 2023-08-13 DIAGNOSIS — I890 Lymphedema, not elsewhere classified: Secondary | ICD-10-CM | POA: Diagnosis not present

## 2023-08-13 DIAGNOSIS — I517 Cardiomegaly: Secondary | ICD-10-CM | POA: Diagnosis not present

## 2023-08-21 DIAGNOSIS — I10 Essential (primary) hypertension: Secondary | ICD-10-CM | POA: Diagnosis not present

## 2023-08-21 DIAGNOSIS — N1831 Chronic kidney disease, stage 3a: Secondary | ICD-10-CM | POA: Diagnosis not present

## 2023-08-21 DIAGNOSIS — I879 Disorder of vein, unspecified: Secondary | ICD-10-CM | POA: Diagnosis not present

## 2023-08-21 DIAGNOSIS — I890 Lymphedema, not elsewhere classified: Secondary | ICD-10-CM | POA: Diagnosis not present

## 2023-08-21 DIAGNOSIS — I129 Hypertensive chronic kidney disease with stage 1 through stage 4 chronic kidney disease, or unspecified chronic kidney disease: Secondary | ICD-10-CM | POA: Diagnosis not present

## 2023-08-31 DIAGNOSIS — Z0100 Encounter for examination of eyes and vision without abnormal findings: Secondary | ICD-10-CM | POA: Diagnosis not present

## 2023-08-31 DIAGNOSIS — H26492 Other secondary cataract, left eye: Secondary | ICD-10-CM | POA: Diagnosis not present

## 2023-09-12 DIAGNOSIS — H26492 Other secondary cataract, left eye: Secondary | ICD-10-CM | POA: Diagnosis not present

## 2023-09-25 DIAGNOSIS — R0602 Shortness of breath: Secondary | ICD-10-CM | POA: Diagnosis not present

## 2023-09-25 DIAGNOSIS — R0689 Other abnormalities of breathing: Secondary | ICD-10-CM | POA: Diagnosis not present

## 2023-09-25 DIAGNOSIS — R0600 Dyspnea, unspecified: Secondary | ICD-10-CM | POA: Diagnosis not present

## 2023-09-27 ENCOUNTER — Other Ambulatory Visit: Payer: Self-pay | Admitting: Pulmonary Disease

## 2023-09-27 DIAGNOSIS — R0600 Dyspnea, unspecified: Secondary | ICD-10-CM

## 2023-09-27 DIAGNOSIS — R0602 Shortness of breath: Secondary | ICD-10-CM

## 2023-10-05 ENCOUNTER — Ambulatory Visit: Admission: RE | Admit: 2023-10-05 | Payer: Medicare HMO | Source: Ambulatory Visit

## 2023-10-05 ENCOUNTER — Ambulatory Visit
Admission: RE | Admit: 2023-10-05 | Discharge: 2023-10-05 | Disposition: A | Discharge status: Home / Self Care | Payer: Medicare HMO | Source: Ambulatory Visit | Attending: Pulmonary Disease | Admitting: Pulmonary Disease

## 2023-10-05 DIAGNOSIS — R0602 Shortness of breath: Secondary | ICD-10-CM | POA: Insufficient documentation

## 2023-10-05 DIAGNOSIS — R0689 Other abnormalities of breathing: Secondary | ICD-10-CM | POA: Diagnosis not present

## 2023-10-05 DIAGNOSIS — R0600 Dyspnea, unspecified: Secondary | ICD-10-CM | POA: Diagnosis not present

## 2023-10-05 DIAGNOSIS — R590 Localized enlarged lymph nodes: Secondary | ICD-10-CM | POA: Diagnosis not present

## 2023-10-05 DIAGNOSIS — I2782 Chronic pulmonary embolism: Secondary | ICD-10-CM | POA: Diagnosis not present

## 2023-10-05 DIAGNOSIS — R918 Other nonspecific abnormal finding of lung field: Secondary | ICD-10-CM | POA: Diagnosis not present

## 2023-10-05 DIAGNOSIS — I2510 Atherosclerotic heart disease of native coronary artery without angina pectoris: Secondary | ICD-10-CM | POA: Diagnosis not present

## 2023-10-05 LAB — POCT I-STAT CREATININE: Creatinine, Ser: 1.1 mg/dL — ABNORMAL HIGH (ref 0.44–1.00)

## 2023-10-05 MED ORDER — IOHEXOL 350 MG/ML SOLN
75.0000 mL | Freq: Once | INTRAVENOUS | Status: AC | PRN
  Administered 2023-10-05: 75 mL via INTRAVENOUS

## 2023-10-18 DIAGNOSIS — R0600 Dyspnea, unspecified: Secondary | ICD-10-CM | POA: Diagnosis not present

## 2023-10-18 DIAGNOSIS — R0689 Other abnormalities of breathing: Secondary | ICD-10-CM | POA: Diagnosis not present

## 2023-10-18 DIAGNOSIS — I272 Pulmonary hypertension, unspecified: Secondary | ICD-10-CM | POA: Diagnosis not present

## 2023-10-18 DIAGNOSIS — G4733 Obstructive sleep apnea (adult) (pediatric): Secondary | ICD-10-CM | POA: Diagnosis not present

## 2023-10-22 DIAGNOSIS — G4733 Obstructive sleep apnea (adult) (pediatric): Secondary | ICD-10-CM | POA: Diagnosis not present

## 2023-10-22 DIAGNOSIS — R7303 Prediabetes: Secondary | ICD-10-CM | POA: Diagnosis not present

## 2023-10-22 DIAGNOSIS — I10 Essential (primary) hypertension: Secondary | ICD-10-CM | POA: Diagnosis not present

## 2023-10-22 DIAGNOSIS — Z23 Encounter for immunization: Secondary | ICD-10-CM | POA: Diagnosis not present

## 2023-10-22 DIAGNOSIS — Z79899 Other long term (current) drug therapy: Secondary | ICD-10-CM | POA: Diagnosis not present

## 2023-10-22 DIAGNOSIS — E7800 Pure hypercholesterolemia, unspecified: Secondary | ICD-10-CM | POA: Diagnosis not present

## 2023-10-22 DIAGNOSIS — Z0000 Encounter for general adult medical examination without abnormal findings: Secondary | ICD-10-CM | POA: Diagnosis not present

## 2023-10-22 DIAGNOSIS — E039 Hypothyroidism, unspecified: Secondary | ICD-10-CM | POA: Diagnosis not present

## 2023-10-23 ENCOUNTER — Encounter: Payer: Self-pay | Admitting: *Deleted

## 2023-10-24 DIAGNOSIS — M19012 Primary osteoarthritis, left shoulder: Secondary | ICD-10-CM | POA: Diagnosis not present

## 2023-10-24 DIAGNOSIS — G8929 Other chronic pain: Secondary | ICD-10-CM | POA: Diagnosis not present

## 2023-10-29 DIAGNOSIS — G4733 Obstructive sleep apnea (adult) (pediatric): Secondary | ICD-10-CM | POA: Diagnosis not present

## 2023-10-29 DIAGNOSIS — I10 Essential (primary) hypertension: Secondary | ICD-10-CM | POA: Diagnosis not present

## 2023-10-29 DIAGNOSIS — I272 Pulmonary hypertension, unspecified: Secondary | ICD-10-CM | POA: Diagnosis not present

## 2023-10-29 DIAGNOSIS — R0602 Shortness of breath: Secondary | ICD-10-CM | POA: Diagnosis not present

## 2023-10-29 DIAGNOSIS — E7800 Pure hypercholesterolemia, unspecified: Secondary | ICD-10-CM | POA: Diagnosis not present

## 2023-11-03 IMAGING — MG MM DIGITAL SCREENING BILAT W/ TOMO AND CAD
8 series · 8 of 24 positions shown · non-contrast
Comparison: Previous exam(s).

CLINICAL DATA: Screening.

EXAM:
DIGITAL SCREENING BILATERAL MAMMOGRAM WITH TOMOSYNTHESIS AND CAD
TECHNIQUE: Bilateral screening digital craniocaudal and mediolateral oblique
mammograms were obtained. Bilateral screening digital breast
tomosynthesis was performed. The images were evaluated with
computer-aided detection.

[L MLO synth-2D]
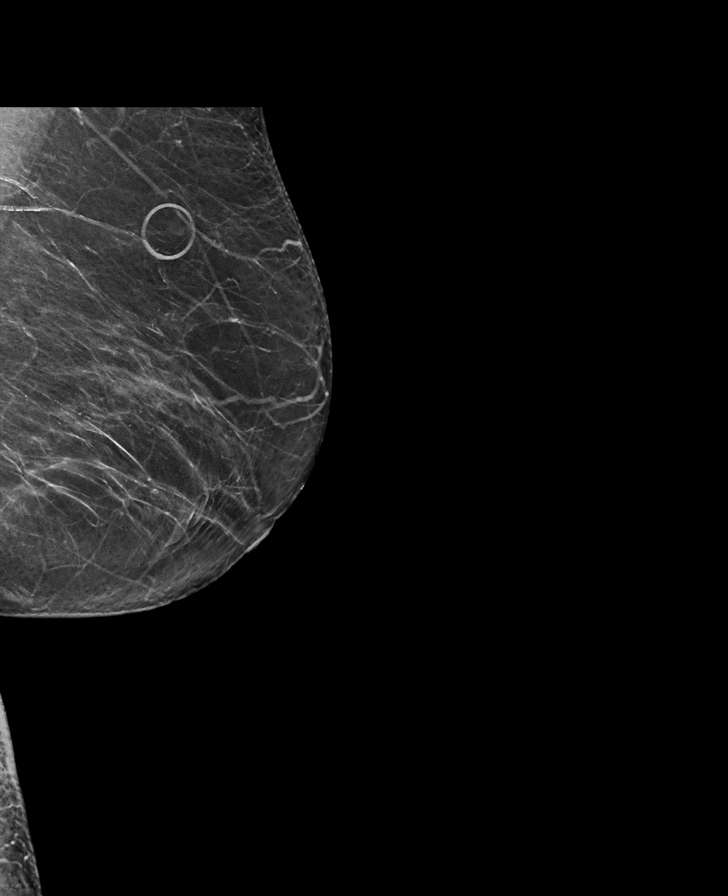

[R MLO synth-2D]
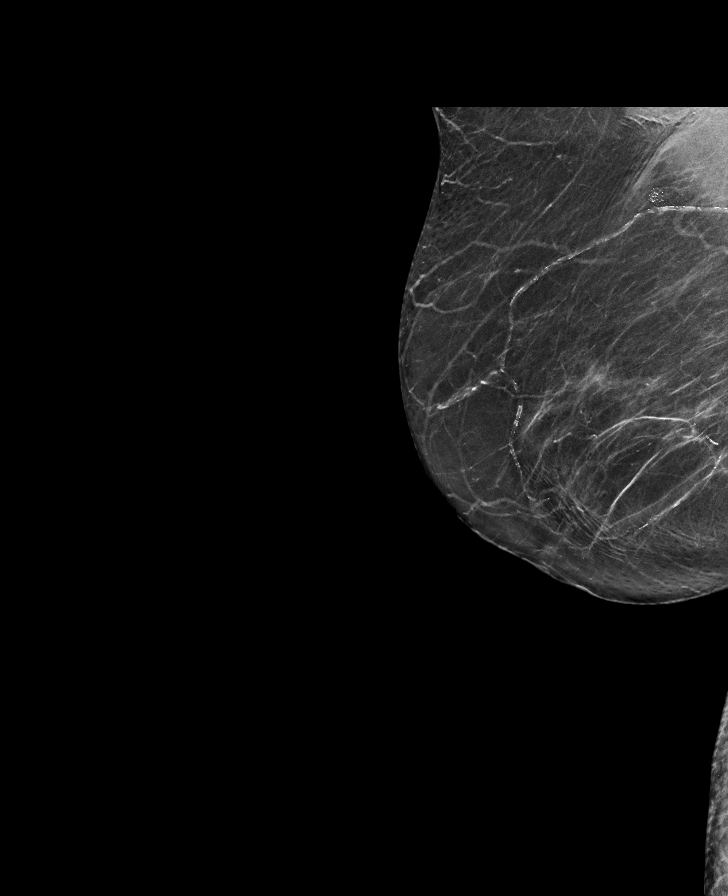

[L CC synth-2D]
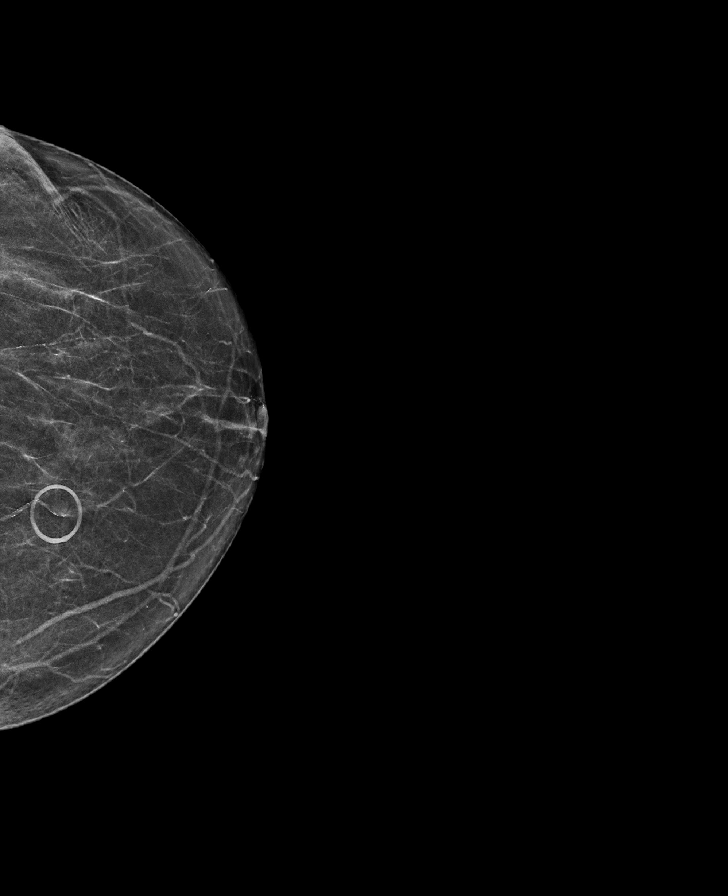

[R CC synth-2D]
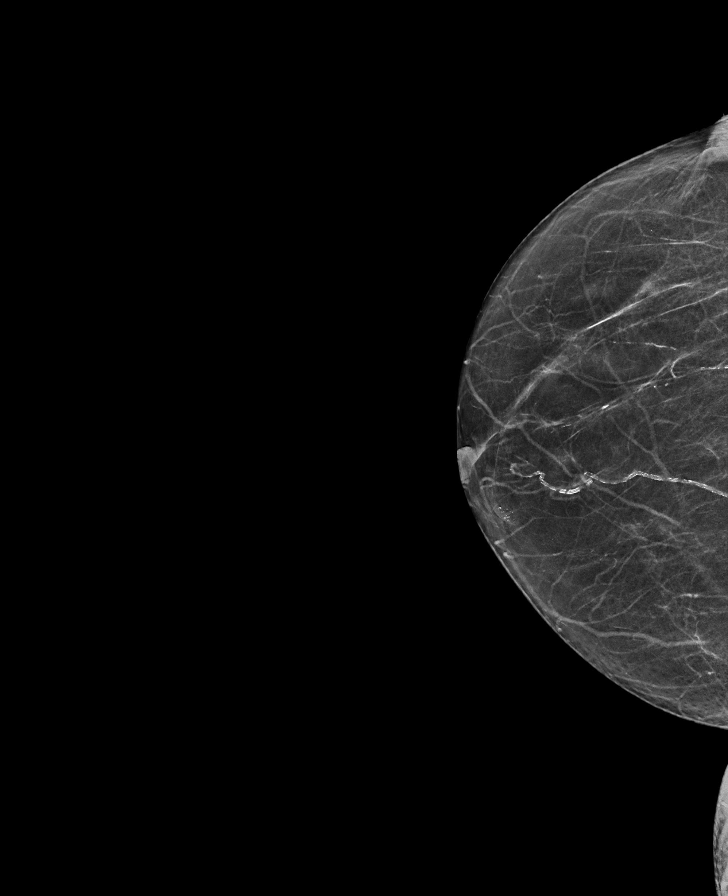

[L MLO tomo · tomo slice 33/64.0]
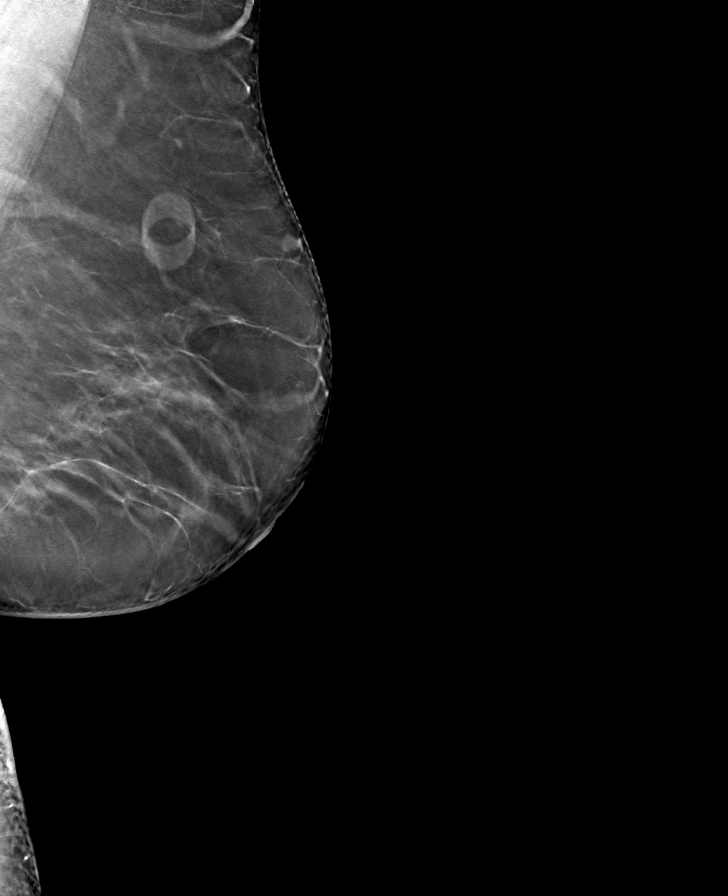

[R CC tomo · tomo slice 29/57.0]
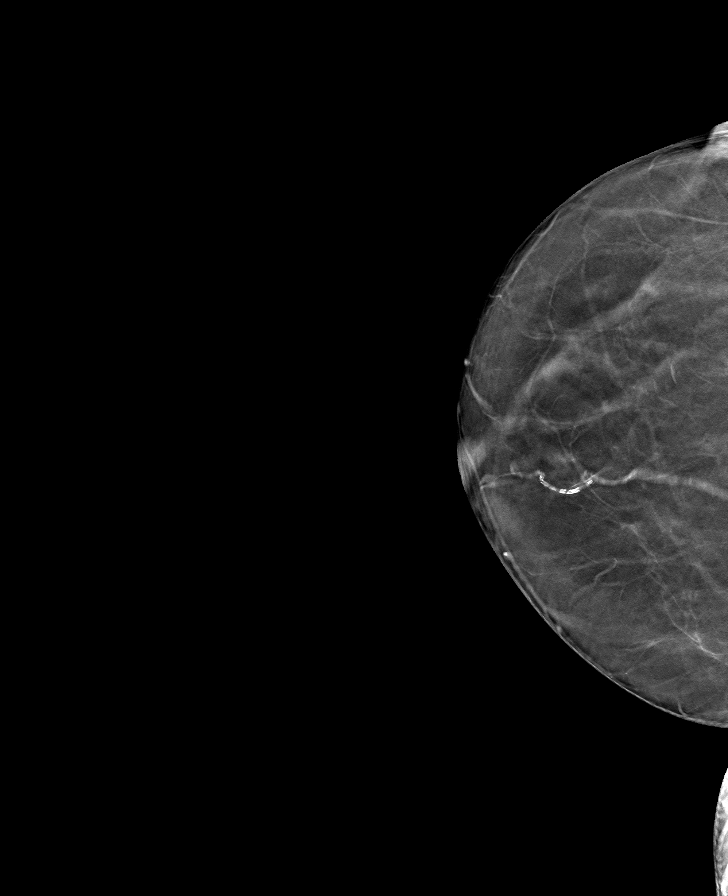

[R MLO tomo · tomo slice 33/65.0]
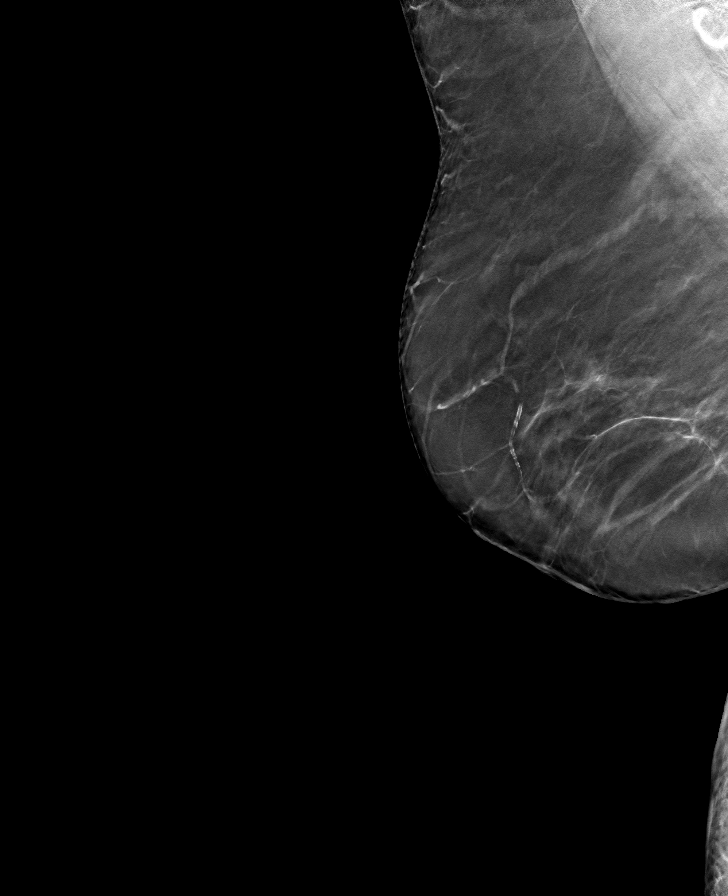

[L CC tomo · tomo slice 31/61.0]
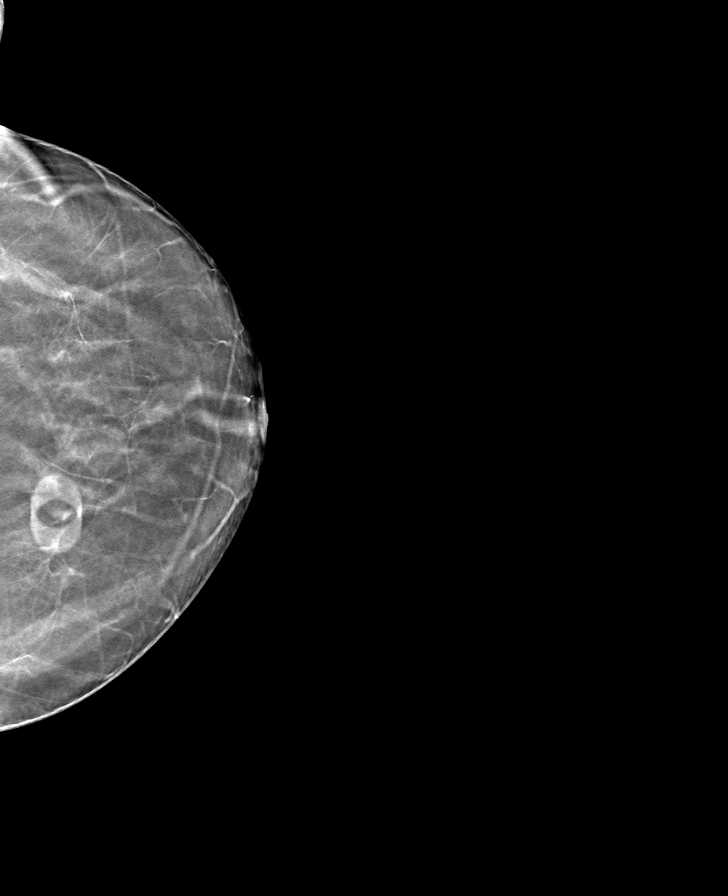

[8 of 24 positions shown; findings below may reference images not displayed]

ACR Breast Density Category b: There are scattered areas of
fibroglandular density.
FINDINGS: There are no findings suspicious for malignancy.
IMPRESSION: No mammographic evidence of malignancy. A result letter of this
screening mammogram will be mailed directly to the patient.

RECOMMENDATION:
Screening mammogram in one year. (Code:51-O-LD2)

BI-RADS CATEGORY  1: Negative.

## 2023-11-05 DIAGNOSIS — I272 Pulmonary hypertension, unspecified: Secondary | ICD-10-CM | POA: Diagnosis not present

## 2023-11-09 ENCOUNTER — Encounter: Payer: Self-pay | Admitting: *Deleted

## 2023-11-12 ENCOUNTER — Ambulatory Visit: Payer: Medicare HMO | Admitting: Anesthesiology

## 2023-11-12 ENCOUNTER — Ambulatory Visit
Admission: RE | Admit: 2023-11-12 | Discharge: 2023-11-12 | Disposition: A | Discharge status: Home / Self Care | Payer: Medicare HMO | Source: Ambulatory Visit | Attending: Gastroenterology | Admitting: Gastroenterology

## 2023-11-12 ENCOUNTER — Encounter
Admission: RE | Disposition: A | Discharge status: Home / Self Care | Payer: Self-pay | Source: Ambulatory Visit | Attending: Gastroenterology

## 2023-11-12 ENCOUNTER — Encounter: Payer: Self-pay | Admitting: *Deleted

## 2023-11-12 ENCOUNTER — Other Ambulatory Visit: Payer: Self-pay

## 2023-11-12 DIAGNOSIS — Z7951 Long term (current) use of inhaled steroids: Secondary | ICD-10-CM | POA: Diagnosis not present

## 2023-11-12 DIAGNOSIS — Z86711 Personal history of pulmonary embolism: Secondary | ICD-10-CM | POA: Insufficient documentation

## 2023-11-12 DIAGNOSIS — J45909 Unspecified asthma, uncomplicated: Secondary | ICD-10-CM | POA: Insufficient documentation

## 2023-11-12 DIAGNOSIS — N189 Chronic kidney disease, unspecified: Secondary | ICD-10-CM | POA: Diagnosis not present

## 2023-11-12 DIAGNOSIS — K449 Diaphragmatic hernia without obstruction or gangrene: Secondary | ICD-10-CM | POA: Insufficient documentation

## 2023-11-12 DIAGNOSIS — K219 Gastro-esophageal reflux disease without esophagitis: Secondary | ICD-10-CM | POA: Diagnosis not present

## 2023-11-12 DIAGNOSIS — I509 Heart failure, unspecified: Secondary | ICD-10-CM | POA: Insufficient documentation

## 2023-11-12 DIAGNOSIS — N183 Chronic kidney disease, stage 3 unspecified: Secondary | ICD-10-CM | POA: Diagnosis not present

## 2023-11-12 DIAGNOSIS — Z87891 Personal history of nicotine dependence: Secondary | ICD-10-CM | POA: Diagnosis not present

## 2023-11-12 DIAGNOSIS — R1310 Dysphagia, unspecified: Secondary | ICD-10-CM | POA: Insufficient documentation

## 2023-11-12 DIAGNOSIS — I130 Hypertensive heart and chronic kidney disease with heart failure and stage 1 through stage 4 chronic kidney disease, or unspecified chronic kidney disease: Secondary | ICD-10-CM | POA: Diagnosis not present

## 2023-11-12 DIAGNOSIS — Z86718 Personal history of other venous thrombosis and embolism: Secondary | ICD-10-CM | POA: Diagnosis not present

## 2023-11-12 HISTORY — PX: ESOPHAGOGASTRODUODENOSCOPY (EGD) WITH PROPOFOL: SHX5813

## 2023-11-12 HISTORY — PX: BIOPSY: SHX5522

## 2023-11-12 HISTORY — DX: Localized edema: R60.0

## 2023-11-12 SURGERY — ESOPHAGOGASTRODUODENOSCOPY (EGD) WITH PROPOFOL
Anesthesia: General

## 2023-11-12 MED ORDER — LIDOCAINE HCL (CARDIAC) PF 100 MG/5ML IV SOSY
PREFILLED_SYRINGE | INTRAVENOUS | Status: DC | PRN
  Administered 2023-11-12: 40 mg via INTRAVENOUS

## 2023-11-12 MED ORDER — SODIUM CHLORIDE 0.9 % IV SOLN: INTRAVENOUS | Status: DC

## 2023-11-12 MED ORDER — PROPOFOL 10 MG/ML IV BOLUS
INTRAVENOUS | Status: DC | PRN
  Administered 2023-11-12: 20 mg via INTRAVENOUS
  Administered 2023-11-12: 40 mg via INTRAVENOUS

## 2023-11-12 MED ORDER — PROPOFOL 500 MG/50ML IV EMUL
INTRAVENOUS | Status: DC | PRN
  Administered 2023-11-12: 70 ug/kg/min via INTRAVENOUS
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
## 2023-11-12 NOTE — Anesthesia Postprocedure Evaluation (Signed)
Anesthesia Post Note  Patient: Nancy Moyer  Procedure(s) Performed: ESOPHAGOGASTRODUODENOSCOPY (EGD) WITH PROPOFOL  Patient location during evaluation: Endoscopy Anesthesia Type: General Level of consciousness: awake and alert Pain management: pain level controlled Vital Signs Assessment: post-procedure vital signs reviewed and stable Respiratory status: spontaneous breathing, nonlabored ventilation, respiratory function stable and patient connected to nasal cannula oxygen Cardiovascular status: blood pressure returned to baseline and stable Postop Assessment: no apparent nausea or vomiting Anesthetic complications: no   No notable events documented.   Last Vitals:  Vitals:   11/12/23 1049 11/12/23 1059  BP: 134/73 (!) 145/84  Pulse: 71 73  Resp: 12 15  Temp:    SpO2: 100% 100%    Last Pain:  Vitals:   11/12/23 1059  TempSrc:   PainSc: 0-No pain                 Louie Boston

## 2023-11-12 NOTE — Op Note (Signed)
[facility] Gastroenterology Patient Name: Nancy Moyer Procedure Date: 11/12/2023 10:13 AM MRN: [id number] Account #: [id number] Date of Birth: 1936-11-26 Admit Type: Outpatient Age: 87 Room: [facility] ENDO ROOM 3 Gender: Female Note Status: Finalized Instrument Name: Patton Salles Endoscope [id number] Procedure:             Upper GI endoscopy Indications:           Dysphagia Providers:             Eather Colas MD, MD Referring MD:          Duane Lope. Judithann Sheen, MD (Referring MD) Medicines:             Monitored Anesthesia Care Complications:         No immediate complications. Estimated blood loss:                         Minimal. Procedure:             Pre-Anesthesia Assessment:                        - Prior to the procedure, a History and Physical was                         performed, and patient medications and allergies were                         reviewed. The patient is competent. The risks and                         benefits of the procedure and the sedation options and                         risks were discussed with the patient. All questions                         were answered and informed consent was obtained.                         Patient identification and proposed procedure were                         verified by the physician, the nurse, the                         anesthesiologist, the anesthetist and the technician                         in the endoscopy suite. Mental Status Examination:                         alert and oriented. Airway Examination: normal                         oropharyngeal airway and neck mobility. Respiratory                         Examination: clear to auscultation. CV Examination:  normal. Prophylactic Antibiotics: The patient does not                         require prophylactic antibiotics. Prior                         Anticoagulants: The patient has taken no anticoagulant                          or antiplatelet agents. ASA Grade Assessment: III - A                         patient with severe systemic disease. After reviewing                         the risks and benefits, the patient was deemed in                         satisfactory condition to undergo the procedure. The                         anesthesia plan was to use monitored anesthesia care                         (MAC). Immediately prior to administration of                         medications, the patient was re-assessed for adequacy                         to receive sedatives. The heart rate, respiratory                         rate, oxygen saturations, blood pressure, adequacy of                         pulmonary ventilation, and response to care were                         monitored throughout the procedure. The physical                         status of the patient was re-assessed after the                         procedure.                        After obtaining informed consent, the endoscope was                         passed under direct vision. Throughout the procedure,                         the patient's blood pressure, pulse, and oxygen                         saturations were monitored continuously. The Endoscope  was introduced through the mouth, and advanced to the                         second part of duodenum. The upper GI endoscopy was                         accomplished without difficulty. The patient tolerated                         the procedure well. Findings:      A small hiatal hernia was present.      The exam of the esophagus was otherwise normal.      Biopsies were obtained from the proximal and distal esophagus with cold       forceps for histology of suspected eosinophilic esophagitis. Estimated       blood loss was minimal.      The entire examined stomach was normal.      The examined duodenum was normal. Impression:            - Small hiatal hernia.                         - Normal stomach.                        - Normal examined duodenum.                        - Biopsies were taken with a cold forceps for                         evaluation of eosinophilic esophagitis. Recommendation:        - Discharge patient to home.                        - Resume previous diet.                        - Continue present medications.                        - Await pathology results.                        - Return to referring physician as previously                         scheduled. Procedure Code(s):     --- Professional ---                        [phone], Esophagogastroduodenoscopy, flexible,                         transoral; with biopsy, single or multiple Diagnosis Code(s):     --- Professional ---                        K44.9, Diaphragmatic hernia without obstruction or                         gangrene  R13.10, Dysphagia, unspecified CPT copyright 2022 American Medical Association. All rights reserved. The codes documented in this report are preliminary and upon coder review may  be revised to meet current compliance requirements. Eather Colas MD, MD 11/12/2023 10:39:45 AM Number of Addenda: 0 Note Initiated On: 11/12/2023 10:13 AM Estimated Blood Loss:  Estimated blood loss was minimal.      [facility]

## 2023-11-12 NOTE — Interval H&P Note (Signed)
History and Physical Interval Note:  11/12/2023 10:20 AM  Nancy Moyer  has presented today for surgery, with the diagnosis of esophageal sysphagia.  The various methods of treatment have been discussed with the patient and family. After consideration of risks, benefits and other options for treatment, the patient has consented to  Procedure(s): ESOPHAGOGASTRODUODENOSCOPY (EGD) WITH PROPOFOL (N/A) as a surgical intervention.  The patient's history has been reviewed, patient examined, no change in status, stable for surgery.  I have reviewed the patient's chart and labs.  Questions were answered to the patient's satisfaction.     Regis Bill  Ok to proceed with EGD

## 2023-11-12 NOTE — Transfer of Care (Signed)
Immediate Anesthesia Transfer of Care Note  Patient: Nancy Moyer  Procedure(s) Performed: ESOPHAGOGASTRODUODENOSCOPY (EGD) WITH PROPOFOL  Patient Location: PACU  Anesthesia Type:General  Level of Consciousness: awake  Airway & Oxygen Therapy: Patient Spontanous Breathing  Post-op Assessment: Report given to RN and Post -op Vital signs reviewed and stable  Post vital signs: Reviewed and stable  Last Vitals: IVFs OFF and RN instructed to leave off for CHF hx. Vitals Value Taken Time  BP 118/69 11/12/23 1039  Temp 35.7 C 11/12/23 1039  Pulse 79 11/12/23 1040  Resp 18 11/12/23 1039  SpO2 98 % 11/12/23 1040  Vitals shown include unfiled device data.  Last Pain: zero Vitals:   11/12/23 1039  TempSrc: Tympanic  PainSc: Asleep         Complications: No notable events documented.
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
## 2023-11-13 ENCOUNTER — Encounter: Payer: Self-pay | Admitting: Gastroenterology

## 2023-11-13 LAB — SURGICAL PATHOLOGY

## 2024-01-10 DIAGNOSIS — R0602 Shortness of breath: Secondary | ICD-10-CM | POA: Diagnosis not present

## 2024-01-17 DIAGNOSIS — J4540 Moderate persistent asthma, uncomplicated: Secondary | ICD-10-CM | POA: Diagnosis not present

## 2024-01-17 DIAGNOSIS — Z86711 Personal history of pulmonary embolism: Secondary | ICD-10-CM | POA: Diagnosis not present

## 2024-01-17 DIAGNOSIS — I272 Pulmonary hypertension, unspecified: Secondary | ICD-10-CM | POA: Diagnosis not present

## 2024-01-17 DIAGNOSIS — G4733 Obstructive sleep apnea (adult) (pediatric): Secondary | ICD-10-CM | POA: Diagnosis not present

## 2024-02-05 DIAGNOSIS — G4733 Obstructive sleep apnea (adult) (pediatric): Secondary | ICD-10-CM | POA: Diagnosis not present

## 2024-02-07 DIAGNOSIS — Z86718 Personal history of other venous thrombosis and embolism: Secondary | ICD-10-CM | POA: Diagnosis not present

## 2024-02-07 DIAGNOSIS — I77810 Thoracic aortic ectasia: Secondary | ICD-10-CM | POA: Diagnosis not present

## 2024-02-07 DIAGNOSIS — I10 Essential (primary) hypertension: Secondary | ICD-10-CM | POA: Diagnosis not present

## 2024-02-07 DIAGNOSIS — Z79899 Other long term (current) drug therapy: Secondary | ICD-10-CM | POA: Diagnosis not present

## 2024-02-07 DIAGNOSIS — E7800 Pure hypercholesterolemia, unspecified: Secondary | ICD-10-CM | POA: Diagnosis not present

## 2024-02-07 DIAGNOSIS — I272 Pulmonary hypertension, unspecified: Secondary | ICD-10-CM | POA: Diagnosis not present

## 2024-02-07 DIAGNOSIS — G4733 Obstructive sleep apnea (adult) (pediatric): Secondary | ICD-10-CM | POA: Diagnosis not present

## 2024-02-07 DIAGNOSIS — R8290 Unspecified abnormal findings in urine: Secondary | ICD-10-CM | POA: Diagnosis not present

## 2024-02-07 DIAGNOSIS — E039 Hypothyroidism, unspecified: Secondary | ICD-10-CM | POA: Diagnosis not present

## 2024-02-07 DIAGNOSIS — E782 Mixed hyperlipidemia: Secondary | ICD-10-CM | POA: Diagnosis not present

## 2024-02-07 DIAGNOSIS — R7303 Prediabetes: Secondary | ICD-10-CM | POA: Diagnosis not present

## 2024-02-08 DIAGNOSIS — R8290 Unspecified abnormal findings in urine: Secondary | ICD-10-CM | POA: Diagnosis not present

## 2024-02-19 DIAGNOSIS — I10 Essential (primary) hypertension: Secondary | ICD-10-CM | POA: Diagnosis not present

## 2024-02-19 DIAGNOSIS — N1831 Chronic kidney disease, stage 3a: Secondary | ICD-10-CM | POA: Diagnosis not present

## 2024-02-19 DIAGNOSIS — I890 Lymphedema, not elsewhere classified: Secondary | ICD-10-CM | POA: Diagnosis not present

## 2024-02-19 DIAGNOSIS — I879 Disorder of vein, unspecified: Secondary | ICD-10-CM | POA: Diagnosis not present

## 2024-02-19 DIAGNOSIS — I129 Hypertensive chronic kidney disease with stage 1 through stage 4 chronic kidney disease, or unspecified chronic kidney disease: Secondary | ICD-10-CM | POA: Diagnosis not present

## 2024-03-14 DIAGNOSIS — H00015 Hordeolum externum left lower eyelid: Secondary | ICD-10-CM | POA: Diagnosis not present

## 2024-05-19 DIAGNOSIS — Z0000 Encounter for general adult medical examination without abnormal findings: Secondary | ICD-10-CM | POA: Diagnosis not present

## 2024-05-19 DIAGNOSIS — I10 Essential (primary) hypertension: Secondary | ICD-10-CM | POA: Diagnosis not present

## 2024-05-19 DIAGNOSIS — E785 Hyperlipidemia, unspecified: Secondary | ICD-10-CM | POA: Diagnosis not present

## 2024-05-19 DIAGNOSIS — R7303 Prediabetes: Secondary | ICD-10-CM | POA: Diagnosis not present

## 2024-05-19 DIAGNOSIS — E079 Disorder of thyroid, unspecified: Secondary | ICD-10-CM | POA: Diagnosis not present

## 2024-05-19 DIAGNOSIS — Z79899 Other long term (current) drug therapy: Secondary | ICD-10-CM | POA: Diagnosis not present

## 2024-06-18 DIAGNOSIS — M778 Other enthesopathies, not elsewhere classified: Secondary | ICD-10-CM | POA: Diagnosis not present

## 2024-06-18 DIAGNOSIS — M19012 Primary osteoarthritis, left shoulder: Secondary | ICD-10-CM | POA: Diagnosis not present

## 2024-06-18 DIAGNOSIS — G8929 Other chronic pain: Secondary | ICD-10-CM | POA: Diagnosis not present

## 2024-06-18 DIAGNOSIS — M7542 Impingement syndrome of left shoulder: Secondary | ICD-10-CM | POA: Diagnosis not present

## 2024-07-15 DIAGNOSIS — J4540 Moderate persistent asthma, uncomplicated: Secondary | ICD-10-CM | POA: Diagnosis not present

## 2024-07-15 DIAGNOSIS — R0602 Shortness of breath: Secondary | ICD-10-CM | POA: Diagnosis not present

## 2024-07-15 DIAGNOSIS — G4733 Obstructive sleep apnea (adult) (pediatric): Secondary | ICD-10-CM | POA: Diagnosis not present

## 2024-08-21 DIAGNOSIS — R7303 Prediabetes: Secondary | ICD-10-CM | POA: Diagnosis not present

## 2024-08-21 DIAGNOSIS — Z79899 Other long term (current) drug therapy: Secondary | ICD-10-CM | POA: Diagnosis not present

## 2024-08-21 DIAGNOSIS — E7800 Pure hypercholesterolemia, unspecified: Secondary | ICD-10-CM | POA: Diagnosis not present

## 2024-08-21 DIAGNOSIS — I10 Essential (primary) hypertension: Secondary | ICD-10-CM | POA: Diagnosis not present

## 2024-08-21 DIAGNOSIS — M25512 Pain in left shoulder: Secondary | ICD-10-CM | POA: Diagnosis not present

## 2024-08-21 DIAGNOSIS — R0602 Shortness of breath: Secondary | ICD-10-CM | POA: Diagnosis not present

## 2024-08-21 DIAGNOSIS — R8290 Unspecified abnormal findings in urine: Secondary | ICD-10-CM | POA: Diagnosis not present

## 2024-08-21 DIAGNOSIS — G8929 Other chronic pain: Secondary | ICD-10-CM | POA: Diagnosis not present

## 2024-08-21 DIAGNOSIS — E039 Hypothyroidism, unspecified: Secondary | ICD-10-CM | POA: Diagnosis not present

## 2024-08-22 DIAGNOSIS — R8290 Unspecified abnormal findings in urine: Secondary | ICD-10-CM | POA: Diagnosis not present

## 2024-08-25 DIAGNOSIS — I129 Hypertensive chronic kidney disease with stage 1 through stage 4 chronic kidney disease, or unspecified chronic kidney disease: Secondary | ICD-10-CM | POA: Diagnosis not present

## 2024-08-25 DIAGNOSIS — I10 Essential (primary) hypertension: Secondary | ICD-10-CM | POA: Diagnosis not present

## 2024-08-25 DIAGNOSIS — I890 Lymphedema, not elsewhere classified: Secondary | ICD-10-CM | POA: Diagnosis not present

## 2024-08-25 DIAGNOSIS — N1831 Chronic kidney disease, stage 3a: Secondary | ICD-10-CM | POA: Diagnosis not present

## 2024-08-25 DIAGNOSIS — I879 Disorder of vein, unspecified: Secondary | ICD-10-CM | POA: Diagnosis not present

## 2024-08-28 ENCOUNTER — Other Ambulatory Visit
Admission: RE | Admit: 2024-08-28 | Discharge: 2024-08-28 | Disposition: A | Discharge status: Home / Self Care | Source: Ambulatory Visit | Attending: Pulmonary Disease | Admitting: Pulmonary Disease

## 2024-08-28 DIAGNOSIS — Z23 Encounter for immunization: Secondary | ICD-10-CM | POA: Diagnosis not present

## 2024-08-28 DIAGNOSIS — K219 Gastro-esophageal reflux disease without esophagitis: Secondary | ICD-10-CM | POA: Diagnosis not present

## 2024-08-28 DIAGNOSIS — R600 Localized edema: Secondary | ICD-10-CM | POA: Diagnosis not present

## 2024-08-28 DIAGNOSIS — K449 Diaphragmatic hernia without obstruction or gangrene: Secondary | ICD-10-CM | POA: Diagnosis not present

## 2024-08-28 DIAGNOSIS — R609 Edema, unspecified: Secondary | ICD-10-CM | POA: Diagnosis not present

## 2024-08-28 DIAGNOSIS — G4733 Obstructive sleep apnea (adult) (pediatric): Secondary | ICD-10-CM | POA: Diagnosis not present

## 2024-08-28 DIAGNOSIS — J4540 Moderate persistent asthma, uncomplicated: Secondary | ICD-10-CM | POA: Diagnosis not present

## 2024-08-28 LAB — D-DIMER, QUANTITATIVE: D-Dimer, Quant: 1.36 ug{FEU}/mL — ABNORMAL HIGH (ref 0.00–0.50)

## 2024-08-29 ENCOUNTER — Emergency Department
Admission: EM | Admit: 2024-08-29 | Discharge: 2024-08-30 | Discharge status: Against Medical Advice | Attending: Emergency Medicine | Admitting: Emergency Medicine

## 2024-08-29 ENCOUNTER — Ambulatory Visit: Admission: RE | Admit: 2024-08-29 | Source: Ambulatory Visit

## 2024-08-29 ENCOUNTER — Other Ambulatory Visit: Payer: Self-pay

## 2024-08-29 ENCOUNTER — Encounter: Payer: Self-pay | Admitting: Emergency Medicine

## 2024-08-29 ENCOUNTER — Emergency Department

## 2024-08-29 ENCOUNTER — Other Ambulatory Visit: Payer: Self-pay | Admitting: Emergency Medicine

## 2024-08-29 DIAGNOSIS — R7989 Other specified abnormal findings of blood chemistry: Secondary | ICD-10-CM

## 2024-08-29 DIAGNOSIS — R0602 Shortness of breath: Secondary | ICD-10-CM

## 2024-08-29 DIAGNOSIS — Z5321 Procedure and treatment not carried out due to patient leaving prior to being seen by health care provider: Secondary | ICD-10-CM | POA: Diagnosis not present

## 2024-08-29 LAB — CBC
HCT: 46.7 % — ABNORMAL HIGH (ref 36.0–46.0)
Hemoglobin: 14.7 g/dL (ref 12.0–15.0)
MCH: 30.4 pg (ref 26.0–34.0)
MCHC: 31.5 g/dL (ref 30.0–36.0)
MCV: 96.5 fL (ref 80.0–100.0)
Platelets: 190 K/uL (ref 150–400)
RBC: 4.84 MIL/uL (ref 3.87–5.11)
RDW: 14.2 % (ref 11.5–15.5)
WBC: 6.7 K/uL (ref 4.0–10.5)
nRBC: 0 % (ref 0.0–0.2)

## 2024-08-29 LAB — BASIC METABOLIC PANEL WITH GFR
Anion gap: 11 (ref 5–15)
BUN: 17 mg/dL (ref 8–23)
CO2: 27 mmol/L (ref 22–32)
Calcium: 9.5 mg/dL (ref 8.9–10.3)
Chloride: 102 mmol/L (ref 98–111)
Creatinine, Ser: 0.98 mg/dL (ref 0.44–1.00)
GFR, Estimated: 57 mL/min — ABNORMAL LOW (ref >60)
Glucose, Bld: 102 mg/dL — ABNORMAL HIGH (ref 70–99)
Potassium: 4.2 mmol/L (ref 3.5–5.1)
Sodium: 140 mmol/L (ref 135–145)

## 2024-08-29 NOTE — ED Notes (Signed)
 Pt's family member to first nurse desk. Family stated that they have been here for 5 hours. Family member stated that they are leaving and to be taken off the list.

## 2024-08-29 NOTE — ED Triage Notes (Signed)
 Patient to ED via POV for SOB. Sent for outpatient CT for concern of blood clot (hx of same) but ordered CTA and is allergic to contrast. Sent to ED due to not being able to get new CT order. SOB x3-4 days. Speaking in full sentences without difficulty.

## 2024-08-30 DIAGNOSIS — K219 Gastro-esophageal reflux disease without esophagitis: Secondary | ICD-10-CM | POA: Diagnosis not present

## 2024-08-30 DIAGNOSIS — J449 Chronic obstructive pulmonary disease, unspecified: Secondary | ICD-10-CM | POA: Diagnosis not present

## 2024-08-30 DIAGNOSIS — Z792 Long term (current) use of antibiotics: Secondary | ICD-10-CM | POA: Diagnosis not present

## 2024-08-30 DIAGNOSIS — Z7901 Long term (current) use of anticoagulants: Secondary | ICD-10-CM | POA: Diagnosis not present

## 2024-08-30 DIAGNOSIS — Z20822 Contact with and (suspected) exposure to covid-19: Secondary | ICD-10-CM | POA: Diagnosis not present

## 2024-08-30 DIAGNOSIS — Z79899 Other long term (current) drug therapy: Secondary | ICD-10-CM | POA: Diagnosis not present

## 2024-08-30 DIAGNOSIS — R0602 Shortness of breath: Secondary | ICD-10-CM | POA: Diagnosis not present

## 2024-08-30 DIAGNOSIS — R7989 Other specified abnormal findings of blood chemistry: Secondary | ICD-10-CM | POA: Diagnosis not present

## 2024-08-30 DIAGNOSIS — R0600 Dyspnea, unspecified: Secondary | ICD-10-CM | POA: Diagnosis not present

## 2024-08-30 DIAGNOSIS — I77810 Thoracic aortic ectasia: Secondary | ICD-10-CM | POA: Diagnosis not present

## 2024-08-30 DIAGNOSIS — Z86718 Personal history of other venous thrombosis and embolism: Secondary | ICD-10-CM | POA: Diagnosis not present

## 2024-08-30 DIAGNOSIS — Z7983 Long term (current) use of bisphosphonates: Secondary | ICD-10-CM | POA: Diagnosis not present

## 2024-08-31 DIAGNOSIS — R0600 Dyspnea, unspecified: Secondary | ICD-10-CM | POA: Diagnosis not present

## 2024-09-12 DIAGNOSIS — I10 Essential (primary) hypertension: Secondary | ICD-10-CM | POA: Diagnosis not present

## 2024-09-12 DIAGNOSIS — E782 Mixed hyperlipidemia: Secondary | ICD-10-CM | POA: Diagnosis not present

## 2024-09-12 DIAGNOSIS — G4733 Obstructive sleep apnea (adult) (pediatric): Secondary | ICD-10-CM | POA: Diagnosis not present

## 2024-09-12 DIAGNOSIS — I77810 Thoracic aortic ectasia: Secondary | ICD-10-CM | POA: Diagnosis not present

## 2024-09-12 DIAGNOSIS — R21 Rash and other nonspecific skin eruption: Secondary | ICD-10-CM | POA: Diagnosis not present

## 2024-09-12 DIAGNOSIS — I890 Lymphedema, not elsewhere classified: Secondary | ICD-10-CM | POA: Diagnosis not present

## 2024-09-12 DIAGNOSIS — Z86718 Personal history of other venous thrombosis and embolism: Secondary | ICD-10-CM | POA: Diagnosis not present

## 2024-09-22 DIAGNOSIS — G8929 Other chronic pain: Secondary | ICD-10-CM | POA: Diagnosis not present

## 2024-09-22 DIAGNOSIS — M7542 Impingement syndrome of left shoulder: Secondary | ICD-10-CM | POA: Diagnosis not present

## 2024-09-22 DIAGNOSIS — M25512 Pain in left shoulder: Secondary | ICD-10-CM | POA: Diagnosis not present

## 2024-09-22 DIAGNOSIS — M19012 Primary osteoarthritis, left shoulder: Secondary | ICD-10-CM | POA: Diagnosis not present

## 2024-09-22 DIAGNOSIS — M7582 Other shoulder lesions, left shoulder: Secondary | ICD-10-CM | POA: Diagnosis not present

## 2024-10-09 DIAGNOSIS — Z961 Presence of intraocular lens: Secondary | ICD-10-CM | POA: Diagnosis not present

## 2024-10-09 DIAGNOSIS — H04123 Dry eye syndrome of bilateral lacrimal glands: Secondary | ICD-10-CM | POA: Diagnosis not present

## 2024-10-09 DIAGNOSIS — H26491 Other secondary cataract, right eye: Secondary | ICD-10-CM | POA: Diagnosis not present
